# Patient Record
Sex: Female | Born: 1955 | Race: White | Hispanic: No | Smoking: Never smoker
Health system: Southern US, Community
[De-identification: ages and names within clinical notes are randomized; demographics above are authoritative.]

## PROBLEM LIST (undated history)

## (undated) DIAGNOSIS — E079 Disorder of thyroid, unspecified: Secondary | ICD-10-CM

## (undated) DIAGNOSIS — R001 Bradycardia, unspecified: Secondary | ICD-10-CM

## (undated) DIAGNOSIS — T884XXA Failed or difficult intubation, initial encounter: Secondary | ICD-10-CM

## (undated) DIAGNOSIS — I10 Essential (primary) hypertension: Secondary | ICD-10-CM

## (undated) DIAGNOSIS — M51369 Other intervertebral disc degeneration, lumbar region without mention of lumbar back pain or lower extremity pain: Secondary | ICD-10-CM

## (undated) DIAGNOSIS — K219 Gastro-esophageal reflux disease without esophagitis: Secondary | ICD-10-CM

## (undated) DIAGNOSIS — J4540 Moderate persistent asthma, uncomplicated: Secondary | ICD-10-CM

## (undated) DIAGNOSIS — M5136 Other intervertebral disc degeneration, lumbar region: Secondary | ICD-10-CM

## (undated) DIAGNOSIS — M8580 Other specified disorders of bone density and structure, unspecified site: Secondary | ICD-10-CM

## (undated) DIAGNOSIS — E039 Hypothyroidism, unspecified: Secondary | ICD-10-CM

## (undated) DIAGNOSIS — A528 Late syphilis, latent: Secondary | ICD-10-CM

## (undated) DIAGNOSIS — E7800 Pure hypercholesterolemia, unspecified: Secondary | ICD-10-CM

## (undated) HISTORY — PX: COLONOSCOPY: SHX174

## (undated) HISTORY — PX: TONSILLECTOMY AND ADENOIDECTOMY: SUR1326

## (undated) HISTORY — PX: TUBAL LIGATION: SHX77

## (undated) HISTORY — PX: TONSILLECTOMY: SUR1361

---

## 1992-07-10 HISTORY — PX: BACK SURGERY: SHX140

## 2014-09-12 DIAGNOSIS — E039 Hypothyroidism, unspecified: Secondary | ICD-10-CM | POA: Insufficient documentation

## 2014-09-12 DIAGNOSIS — E7800 Pure hypercholesterolemia, unspecified: Secondary | ICD-10-CM | POA: Insufficient documentation

## 2014-09-12 DIAGNOSIS — I10 Essential (primary) hypertension: Secondary | ICD-10-CM | POA: Insufficient documentation

## 2014-12-24 ENCOUNTER — Other Ambulatory Visit: Payer: Self-pay | Admitting: Internal Medicine

## 2014-12-24 DIAGNOSIS — Z1231 Encounter for screening mammogram for malignant neoplasm of breast: Secondary | ICD-10-CM

## 2014-12-24 DIAGNOSIS — M5136 Other intervertebral disc degeneration, lumbar region: Secondary | ICD-10-CM | POA: Insufficient documentation

## 2014-12-24 DIAGNOSIS — M51369 Other intervertebral disc degeneration, lumbar region without mention of lumbar back pain or lower extremity pain: Secondary | ICD-10-CM | POA: Insufficient documentation

## 2014-12-24 DIAGNOSIS — K219 Gastro-esophageal reflux disease without esophagitis: Secondary | ICD-10-CM | POA: Insufficient documentation

## 2014-12-31 ENCOUNTER — Ambulatory Visit: Payer: Self-pay

## 2015-01-04 ENCOUNTER — Ambulatory Visit
Admission: RE | Admit: 2015-01-04 | Discharge: 2015-01-04 | Disposition: A | Discharge status: Home / Self Care | Payer: 59 | Source: Ambulatory Visit | Attending: Internal Medicine | Admitting: Internal Medicine

## 2015-01-04 DIAGNOSIS — Z1231 Encounter for screening mammogram for malignant neoplasm of breast: Secondary | ICD-10-CM | POA: Diagnosis not present

## 2015-12-02 ENCOUNTER — Other Ambulatory Visit: Payer: Self-pay | Admitting: Internal Medicine

## 2015-12-02 DIAGNOSIS — Z1239 Encounter for other screening for malignant neoplasm of breast: Secondary | ICD-10-CM

## 2016-01-07 ENCOUNTER — Other Ambulatory Visit: Payer: Self-pay | Admitting: Internal Medicine

## 2016-01-07 ENCOUNTER — Ambulatory Visit
Admission: RE | Admit: 2016-01-07 | Discharge: 2016-01-07 | Disposition: A | Discharge status: Home / Self Care | Payer: 59 | Source: Ambulatory Visit | Attending: Internal Medicine | Admitting: Internal Medicine

## 2016-01-07 DIAGNOSIS — Z1231 Encounter for screening mammogram for malignant neoplasm of breast: Secondary | ICD-10-CM | POA: Insufficient documentation

## 2016-01-07 DIAGNOSIS — Z1239 Encounter for other screening for malignant neoplasm of breast: Secondary | ICD-10-CM

## 2016-01-16 ENCOUNTER — Emergency Department
Admission: EM | Admit: 2016-01-16 | Discharge: 2016-01-16 | Disposition: A | Discharge status: Home / Self Care | Payer: 59 | Attending: Student | Admitting: Student

## 2016-01-16 ENCOUNTER — Emergency Department: Payer: 59

## 2016-01-16 DIAGNOSIS — R197 Diarrhea, unspecified: Secondary | ICD-10-CM

## 2016-01-16 DIAGNOSIS — I10 Essential (primary) hypertension: Secondary | ICD-10-CM | POA: Insufficient documentation

## 2016-01-16 HISTORY — DX: Disorder of thyroid, unspecified: E07.9

## 2016-01-16 HISTORY — DX: Essential (primary) hypertension: I10

## 2016-01-16 LAB — URINALYSIS COMPLETE WITH MICROSCOPIC (ARMC ONLY)
Bilirubin Urine: NEGATIVE
GLUCOSE, UA: NEGATIVE mg/dL
Hgb urine dipstick: NEGATIVE
Leukocytes, UA: NEGATIVE
NITRITE: NEGATIVE
PROTEIN: 30 mg/dL — AB
SPECIFIC GRAVITY, URINE: 1.028 (ref 1.005–1.030)
pH: 5 (ref 5.0–8.0)

## 2016-01-16 LAB — COMPREHENSIVE METABOLIC PANEL
ALK PHOS: 67 U/L (ref 38–126)
ALT: 18 U/L (ref 14–54)
ANION GAP: 8 (ref 5–15)
AST: 20 U/L (ref 15–41)
Albumin: 4.3 g/dL (ref 3.5–5.0)
BILIRUBIN TOTAL: 0.3 mg/dL (ref 0.3–1.2)
BUN: 16 mg/dL (ref 6–20)
CALCIUM: 9.1 mg/dL (ref 8.9–10.3)
CO2: 24 mmol/L (ref 22–32)
CREATININE: 0.79 mg/dL (ref 0.44–1.00)
Chloride: 107 mmol/L (ref 101–111)
GFR calc Af Amer: >60 mL/min (ref >60)
GFR calc non Af Amer: >60 mL/min (ref >60)
GLUCOSE: 92 mg/dL (ref 65–99)
Potassium: 3.4 mmol/L — ABNORMAL LOW (ref 3.5–5.1)
SODIUM: 139 mmol/L (ref 135–145)
TOTAL PROTEIN: 7.9 g/dL (ref 6.5–8.1)

## 2016-01-16 LAB — LIPASE, BLOOD: LIPASE: 17 U/L (ref 11–51)

## 2016-01-16 LAB — CBC
HCT: 39.7 % (ref 35.0–47.0)
HEMOGLOBIN: 13.9 g/dL (ref 12.0–16.0)
MCH: 31.7 pg (ref 26.0–34.0)
MCHC: 34.9 g/dL (ref 32.0–36.0)
MCV: 90.7 fL (ref 80.0–100.0)
PLATELETS: 215 10*3/uL (ref 150–440)
RBC: 4.38 MIL/uL (ref 3.80–5.20)
RDW: 13.3 % (ref 11.5–14.5)
WBC: 7.1 10*3/uL (ref 3.6–11.0)

## 2016-01-16 LAB — TROPONIN I: Troponin I: <0.03 ng/mL (ref <0.03)

## 2016-01-16 NOTE — ED Notes (Signed)
Pt verbalized understanding of discharge instructions. NAD at this time. 

## 2016-01-16 NOTE — ED Provider Notes (Signed)
[facility] Emergency Department Provider Note   ____________________________________________  Time seen: Approximately 3:03 PM  I have reviewed the triage vital signs and the nursing notes.   HISTORY  Chief Complaint Headache and Diarrhea    HPI Kathryn Whitehead is a 60 y.o. female with history of hypertension and thyroid disease who presents for evaluation of one day of nonbloody diarrhea, gradual onset, constant since onset, initially severe, now nearly resolved, improved with Pepto-Bismol. She has also had abdominal cramping and nausea without vomiting. She has had mild headache but no vision change, numbness or weakness. She is concerned that she may have "heat exhaustion" as she was working in a wood shed in the heat over the past 2 days. Last night she had a sharp chest pain in the right chest "in a small circle", it lasted a few seconds and resolves, it is nonradiating not associated with exertion, sudden sweating or nausea or vomiting. Was not exertional or pleuritic. She has not had any chest pain since that time. Currently she reports she feels a little bit fatigued but is in no pain.   Past Medical History  Diagnosis Date  . Thyroid disease   . Hypertension     There are no active problems to display for this patient.   History reviewed. No pertinent past surgical history.  No current outpatient prescriptions on file.  Allergies Ampicillin  No family history on file.  Social History Social History  Substance Use Topics  . Smoking status: Never Smoker   . Smokeless tobacco: None  . Alcohol Use: Yes    Review of Systems Constitutional: No fever/chills Eyes: No visual changes. ENT: No sore throat. Cardiovascular: + chest pain. Respiratory: Denies shortness of breath. Gastrointestinal: + abdominal cramping.  + nausea, no vomiting.  + diarrhea.  No constipation. Genitourinary: Negative for dysuria. Musculoskeletal: Negative for back  pain. Skin: Negative for rash. Neurological: Negative for headaches, focal weakness or numbness.  10-point ROS otherwise negative.  ____________________________________________   PHYSICAL EXAM:  VITAL SIGNS: ED Triage Vitals  Enc Vitals Group     BP 01/16/16 1353 114/80 mmHg     Pulse Rate 01/16/16 1353 76     Resp 01/16/16 1353 18     Temp 01/16/16 1353 98.5 F (36.9 C)     Temp Source 01/16/16 1353 Oral     SpO2 01/16/16 1353 97 %     Weight 01/16/16 1353 147 lb (66.679 kg)     Height 01/16/16 1353 5\' 2"  (1.575 m)     Head Cir --      Peak Flow --      Pain Score 01/16/16 1355 3     Pain Loc --      Pain Edu? --      Excl. in [place]? --     Constitutional: Alert and oriented. Well appearing and in no acute distress.Lying comfortably in bed, smiling, pleasant Eyes: Conjunctivae are normal. PERRL. EOMI. Head: Atraumatic. Nose: No congestion/rhinnorhea. Mouth/Throat: Mucous membranes are moist.  Oropharynx non-erythematous. Neck: No stridor.  Supple without meningitis. Cardiovascular: Normal rate, regular rhythm. Grossly normal heart sounds.  Good peripheral circulation. Respiratory: Normal respiratory effort.  No retractions. Lungs CTAB. Gastrointestinal: Soft and nontender. No distention.  No CVA tenderness. Genitourinary: Deferred Musculoskeletal: No lower extremity tenderness nor edema.  No joint effusions. Neurologic:  Normal speech and language. No gross focal neurologic deficits are appreciated. No gait instability. 5 out of 5 strength bilateral upper and lower extremities,  sensation intact to light touch throughout. Skin:  Skin is warm, dry and intact. No rash noted. Psychiatric: Mood and affect are normal. Speech and behavior are normal.  ____________________________________________   LABS (all labs ordered are listed, but only abnormal results are displayed)  Labs Reviewed  COMPREHENSIVE METABOLIC PANEL - Abnormal; Notable for the following:    Potassium 3.4  (*)    All other components within normal limits  URINALYSIS COMPLETEWITH MICROSCOPIC ([facility] ONLY) - Abnormal; Notable for the following:    Color, Urine AMBER (*)    APPearance HAZY (*)    Ketones, ur 1+ (*)    Protein, ur 30 (*)    Bacteria, UA RARE (*)    Squamous Epithelial / LPF 0-5 (*)    All other components within normal limits  LIPASE, BLOOD  CBC  TROPONIN I   ____________________________________________  EKG  ED ECG REPORT I, Joanne Gavel, the attending physician, personally viewed and interpreted this ECG.   Date: 01/16/2016  EKG Time: 13:59  Rate: 75  Rhythm: normal sinus rhythm  Axis: normal  Intervals:none  ST&T Change: No acute ST elevation or acute ST depression.  ____________________________________________  RADIOLOGY  CXR  IMPRESSION: No edema or consolidation.  ____________________________________________   PROCEDURES  Procedure(s) performed: None  Procedures  Critical Care performed: No  ____________________________________________   INITIAL IMPRESSION / ASSESSMENT AND PLAN / ED COURSE  Pertinent labs & imaging results that were available during my care of the patient were reviewed by me and considered in my medical decision making (see chart for details).  Cobi Malcomb is a 61 y.o. female with history of hypertension and thyroid disease who presents for evaluation of one day of nonbloody diarrhea, he is had no diarrhea today. On exam, she is very well-appearing and in no acute distress. Her vital signs are stable, she is afebrile. She has a benign abdominal exam, intact neurological examination, appears very well hydrated. Suspect viral syndrome is the most likely cause of her diarrhea, back, round and with her being out in the heat for long. The time certainly could cause mild dehydration which could account for her symptoms. Nonspecific chest pain occurred once last night, and has not recurred, no history of coronary artery disease,  not consistent with PE or acute aortic dissection. EKG reassuring, troponin negative. Labs reviewed and are generally unremarkable, chest x-ray clear. Awaiting urinalysis after which anticipate discharge home with a structures to push oral fluids and follow up with her primary care doctor within the next 2-3 days. I doubt any acute life threatening pathology and is very well-appearing patient with a benign physical exam, no current pain, normal labs and normal vital signs.  ----------------------------------------- 4:16 PM on 01/16/2016 ----------------------------------------- Urinalysis is not consistent with infection. Patient is postmenopausal. She continues to appear well. We discussed return precautions, need for close PCP follow-up, oral hydration and she is comfortable with the discharge plan. DC home.  ____________________________________________   FINAL CLINICAL IMPRESSION(S) / ED DIAGNOSES  Final diagnoses:  Diarrhea, unspecified type      NEW MEDICATIONS STARTED DURING THIS VISIT:  New Prescriptions   No medications on file     Note:  This document was prepared using Dragon voice recognition software and may include unintentional dictation errors.    Joanne Gavel, MD 01/16/16 [phone]

## 2016-01-16 NOTE — ED Notes (Signed)
Pt latter comments she had episode of sharp CP to right side of chest yesterday.

## 2016-01-16 NOTE — ED Notes (Addendum)
PT arrives to ER via POV c/o headache, diarrhea and generalized "not feeling well". Pt concerned with head exhaustion. Pt alert and oriented X4, active, cooperative, pt in NAD. RR even and unlabored, color WNL.

## 2016-03-30 DIAGNOSIS — Z780 Asymptomatic menopausal state: Secondary | ICD-10-CM | POA: Insufficient documentation

## 2016-07-12 ENCOUNTER — Other Ambulatory Visit: Payer: Self-pay | Admitting: Internal Medicine

## 2016-07-12 DIAGNOSIS — I10 Essential (primary) hypertension: Secondary | ICD-10-CM | POA: Diagnosis not present

## 2016-07-12 DIAGNOSIS — E7800 Pure hypercholesterolemia, unspecified: Secondary | ICD-10-CM | POA: Diagnosis not present

## 2016-07-12 DIAGNOSIS — M545 Low back pain: Secondary | ICD-10-CM | POA: Diagnosis not present

## 2016-07-12 DIAGNOSIS — E039 Hypothyroidism, unspecified: Secondary | ICD-10-CM | POA: Diagnosis not present

## 2016-07-12 DIAGNOSIS — Z1239 Encounter for other screening for malignant neoplasm of breast: Secondary | ICD-10-CM

## 2016-07-12 DIAGNOSIS — G8929 Other chronic pain: Secondary | ICD-10-CM | POA: Diagnosis not present

## 2016-07-12 DIAGNOSIS — M419 Scoliosis, unspecified: Secondary | ICD-10-CM | POA: Diagnosis not present

## 2016-09-05 DIAGNOSIS — N951 Menopausal and female climacteric states: Secondary | ICD-10-CM | POA: Insufficient documentation

## 2016-09-05 DIAGNOSIS — G8929 Other chronic pain: Secondary | ICD-10-CM | POA: Insufficient documentation

## 2016-09-05 DIAGNOSIS — I10 Essential (primary) hypertension: Secondary | ICD-10-CM | POA: Diagnosis not present

## 2016-09-05 DIAGNOSIS — M549 Dorsalgia, unspecified: Secondary | ICD-10-CM | POA: Diagnosis not present

## 2016-09-05 DIAGNOSIS — M545 Low back pain: Secondary | ICD-10-CM | POA: Diagnosis not present

## 2016-10-09 DIAGNOSIS — L814 Other melanin hyperpigmentation: Secondary | ICD-10-CM | POA: Diagnosis not present

## 2016-10-12 DIAGNOSIS — E039 Hypothyroidism, unspecified: Secondary | ICD-10-CM | POA: Diagnosis not present

## 2016-10-12 DIAGNOSIS — E7800 Pure hypercholesterolemia, unspecified: Secondary | ICD-10-CM | POA: Diagnosis not present

## 2016-10-12 DIAGNOSIS — I10 Essential (primary) hypertension: Secondary | ICD-10-CM | POA: Diagnosis not present

## 2017-01-09 ENCOUNTER — Ambulatory Visit
Admission: RE | Admit: 2017-01-09 | Discharge: 2017-01-09 | Disposition: A | Discharge status: Home / Self Care | Payer: 59 | Source: Ambulatory Visit | Attending: Internal Medicine | Admitting: Internal Medicine

## 2017-01-09 ENCOUNTER — Ambulatory Visit: Payer: 59

## 2017-01-09 DIAGNOSIS — Z1231 Encounter for screening mammogram for malignant neoplasm of breast: Secondary | ICD-10-CM | POA: Diagnosis not present

## 2017-01-09 DIAGNOSIS — Z1239 Encounter for other screening for malignant neoplasm of breast: Secondary | ICD-10-CM

## 2017-03-28 DIAGNOSIS — E7800 Pure hypercholesterolemia, unspecified: Secondary | ICD-10-CM | POA: Diagnosis not present

## 2017-03-28 DIAGNOSIS — Z131 Encounter for screening for diabetes mellitus: Secondary | ICD-10-CM | POA: Diagnosis not present

## 2017-03-28 DIAGNOSIS — I10 Essential (primary) hypertension: Secondary | ICD-10-CM | POA: Diagnosis not present

## 2017-04-04 DIAGNOSIS — Z0000 Encounter for general adult medical examination without abnormal findings: Secondary | ICD-10-CM | POA: Diagnosis not present

## 2017-04-04 DIAGNOSIS — J4540 Moderate persistent asthma, uncomplicated: Secondary | ICD-10-CM | POA: Insufficient documentation

## 2017-04-04 DIAGNOSIS — Z23 Encounter for immunization: Secondary | ICD-10-CM | POA: Diagnosis not present

## 2017-04-04 DIAGNOSIS — R21 Rash and other nonspecific skin eruption: Secondary | ICD-10-CM | POA: Diagnosis not present

## 2017-09-26 DIAGNOSIS — E782 Mixed hyperlipidemia: Secondary | ICD-10-CM | POA: Diagnosis not present

## 2017-10-02 DIAGNOSIS — I10 Essential (primary) hypertension: Secondary | ICD-10-CM | POA: Diagnosis not present

## 2017-10-02 DIAGNOSIS — E039 Hypothyroidism, unspecified: Secondary | ICD-10-CM | POA: Diagnosis not present

## 2017-10-02 DIAGNOSIS — R05 Cough: Secondary | ICD-10-CM | POA: Diagnosis not present

## 2017-10-19 DIAGNOSIS — T50905A Adverse effect of unspecified drugs, medicaments and biological substances, initial encounter: Secondary | ICD-10-CM | POA: Diagnosis not present

## 2017-10-19 DIAGNOSIS — L299 Pruritus, unspecified: Secondary | ICD-10-CM | POA: Diagnosis not present

## 2017-10-19 DIAGNOSIS — I10 Essential (primary) hypertension: Secondary | ICD-10-CM | POA: Diagnosis not present

## 2017-11-07 DIAGNOSIS — I10 Essential (primary) hypertension: Secondary | ICD-10-CM | POA: Diagnosis not present

## 2017-11-07 DIAGNOSIS — R001 Bradycardia, unspecified: Secondary | ICD-10-CM | POA: Insufficient documentation

## 2017-12-24 DIAGNOSIS — R768 Other specified abnormal immunological findings in serum: Secondary | ICD-10-CM | POA: Diagnosis not present

## 2018-01-08 DIAGNOSIS — I10 Essential (primary) hypertension: Secondary | ICD-10-CM | POA: Diagnosis not present

## 2018-01-08 DIAGNOSIS — A528 Late syphilis, latent: Secondary | ICD-10-CM | POA: Insufficient documentation

## 2018-02-04 ENCOUNTER — Other Ambulatory Visit: Payer: Self-pay | Admitting: Internal Medicine

## 2018-02-04 DIAGNOSIS — Z1231 Encounter for screening mammogram for malignant neoplasm of breast: Secondary | ICD-10-CM

## 2018-02-19 DIAGNOSIS — I10 Essential (primary) hypertension: Secondary | ICD-10-CM | POA: Diagnosis not present

## 2018-02-22 ENCOUNTER — Ambulatory Visit
Admission: RE | Admit: 2018-02-22 | Discharge: 2018-02-22 | Disposition: A | Discharge status: Home / Self Care | Payer: 59 | Source: Ambulatory Visit | Attending: Internal Medicine | Admitting: Internal Medicine

## 2018-02-22 DIAGNOSIS — Z1231 Encounter for screening mammogram for malignant neoplasm of breast: Secondary | ICD-10-CM | POA: Insufficient documentation

## 2018-04-12 DIAGNOSIS — I10 Essential (primary) hypertension: Secondary | ICD-10-CM | POA: Diagnosis not present

## 2018-04-19 DIAGNOSIS — E7800 Pure hypercholesterolemia, unspecified: Secondary | ICD-10-CM | POA: Diagnosis not present

## 2018-04-19 DIAGNOSIS — I10 Essential (primary) hypertension: Secondary | ICD-10-CM | POA: Diagnosis not present

## 2018-04-19 DIAGNOSIS — E039 Hypothyroidism, unspecified: Secondary | ICD-10-CM | POA: Diagnosis not present

## 2018-04-24 DIAGNOSIS — Z23 Encounter for immunization: Secondary | ICD-10-CM | POA: Diagnosis not present

## 2018-05-24 DIAGNOSIS — I10 Essential (primary) hypertension: Secondary | ICD-10-CM | POA: Diagnosis not present

## 2018-07-11 DIAGNOSIS — E039 Hypothyroidism, unspecified: Secondary | ICD-10-CM | POA: Diagnosis not present

## 2018-07-17 DIAGNOSIS — E039 Hypothyroidism, unspecified: Secondary | ICD-10-CM | POA: Diagnosis not present

## 2018-07-23 DIAGNOSIS — J101 Influenza due to other identified influenza virus with other respiratory manifestations: Secondary | ICD-10-CM | POA: Diagnosis not present

## 2018-07-23 DIAGNOSIS — R509 Fever, unspecified: Secondary | ICD-10-CM | POA: Diagnosis not present

## 2018-10-11 DIAGNOSIS — E039 Hypothyroidism, unspecified: Secondary | ICD-10-CM | POA: Diagnosis not present

## 2018-10-23 ENCOUNTER — Other Ambulatory Visit: Payer: Self-pay | Admitting: Internal Medicine

## 2018-10-23 DIAGNOSIS — Z1231 Encounter for screening mammogram for malignant neoplasm of breast: Secondary | ICD-10-CM

## 2018-10-23 DIAGNOSIS — M8589 Other specified disorders of bone density and structure, multiple sites: Secondary | ICD-10-CM | POA: Insufficient documentation

## 2018-10-23 DIAGNOSIS — Z0000 Encounter for general adult medical examination without abnormal findings: Secondary | ICD-10-CM | POA: Diagnosis not present

## 2018-10-23 DIAGNOSIS — Z1239 Encounter for other screening for malignant neoplasm of breast: Secondary | ICD-10-CM | POA: Diagnosis not present

## 2019-02-05 ENCOUNTER — Other Ambulatory Visit: Payer: Self-pay | Admitting: Physician Assistant

## 2019-02-05 DIAGNOSIS — M542 Cervicalgia: Secondary | ICD-10-CM

## 2019-02-05 DIAGNOSIS — M5412 Radiculopathy, cervical region: Secondary | ICD-10-CM

## 2019-02-17 ENCOUNTER — Ambulatory Visit: Payer: 59

## 2019-02-17 ENCOUNTER — Other Ambulatory Visit: Payer: Self-pay | Admitting: Physician Assistant

## 2019-02-17 ENCOUNTER — Telehealth: Payer: Self-pay | Admitting: Physician Assistant

## 2019-02-17 DIAGNOSIS — Z1231 Encounter for screening mammogram for malignant neoplasm of breast: Secondary | ICD-10-CM

## 2019-02-25 ENCOUNTER — Ambulatory Visit: Payer: 59

## 2019-03-19 ENCOUNTER — Ambulatory Visit
Admission: RE | Admit: 2019-03-19 | Discharge: 2019-03-19 | Disposition: A | Discharge status: Home / Self Care | Payer: 59 | Source: Ambulatory Visit | Attending: Physician Assistant | Admitting: Physician Assistant

## 2019-03-19 DIAGNOSIS — Z1231 Encounter for screening mammogram for malignant neoplasm of breast: Secondary | ICD-10-CM

## 2019-03-25 ENCOUNTER — Other Ambulatory Visit: Payer: Self-pay | Admitting: Physician Assistant

## 2019-03-25 DIAGNOSIS — M542 Cervicalgia: Secondary | ICD-10-CM

## 2019-03-25 DIAGNOSIS — M5412 Radiculopathy, cervical region: Secondary | ICD-10-CM

## 2019-04-03 ENCOUNTER — Ambulatory Visit
Admission: RE | Admit: 2019-04-03 | Discharge: 2019-04-03 | Disposition: A | Discharge status: Home / Self Care | Payer: 59 | Source: Ambulatory Visit | Attending: Physician Assistant | Admitting: Physician Assistant

## 2019-04-03 ENCOUNTER — Other Ambulatory Visit: Payer: Self-pay

## 2019-04-03 DIAGNOSIS — M542 Cervicalgia: Secondary | ICD-10-CM | POA: Diagnosis present

## 2019-04-03 DIAGNOSIS — M5412 Radiculopathy, cervical region: Secondary | ICD-10-CM | POA: Diagnosis present

## 2019-04-10 ENCOUNTER — Other Ambulatory Visit: Payer: Self-pay | Admitting: Neurosurgery

## 2019-04-16 ENCOUNTER — Encounter
Admission: RE | Admit: 2019-04-16 | Discharge: 2019-04-16 | Disposition: A | Discharge status: Home / Self Care | Payer: 59 | Source: Ambulatory Visit | Attending: Neurosurgery | Admitting: Neurosurgery

## 2019-04-16 ENCOUNTER — Other Ambulatory Visit: Payer: Self-pay

## 2019-04-16 DIAGNOSIS — Z01812 Encounter for preprocedural laboratory examination: Secondary | ICD-10-CM | POA: Diagnosis not present

## 2019-04-16 DIAGNOSIS — R001 Bradycardia, unspecified: Secondary | ICD-10-CM | POA: Diagnosis not present

## 2019-04-16 DIAGNOSIS — Z01810 Encounter for preprocedural cardiovascular examination: Secondary | ICD-10-CM | POA: Diagnosis present

## 2019-04-16 HISTORY — DX: Gastro-esophageal reflux disease without esophagitis: K21.9

## 2019-04-16 HISTORY — DX: Pure hypercholesterolemia, unspecified: E78.00

## 2019-04-16 HISTORY — DX: Hypothyroidism, unspecified: E03.9

## 2019-04-16 LAB — PROTIME-INR
INR: 0.9 (ref 0.8–1.2)
Prothrombin Time: 12 seconds (ref 11.4–15.2)

## 2019-04-16 LAB — URINALYSIS, ROUTINE W REFLEX MICROSCOPIC
Bacteria, UA: NONE SEEN
Bilirubin Urine: NEGATIVE
Glucose, UA: NEGATIVE mg/dL
Hgb urine dipstick: NEGATIVE
Ketones, ur: NEGATIVE mg/dL
Nitrite: NEGATIVE
Protein, ur: NEGATIVE mg/dL
Specific Gravity, Urine: 1.019 (ref 1.005–1.030)
pH: 6 (ref 5.0–8.0)

## 2019-04-16 LAB — CBC
HCT: 39.3 % (ref 36.0–46.0)
Hemoglobin: 13 g/dL (ref 12.0–15.0)
MCH: 30.1 pg (ref 26.0–34.0)
MCHC: 33.1 g/dL (ref 30.0–36.0)
MCV: 91 fL (ref 80.0–100.0)
Platelets: 230 10*3/uL (ref 150–400)
RBC: 4.32 MIL/uL (ref 3.87–5.11)
RDW: 13.2 % (ref 11.5–15.5)
WBC: 6.1 10*3/uL (ref 4.0–10.5)
nRBC: 0 % (ref 0.0–0.2)

## 2019-04-16 LAB — BASIC METABOLIC PANEL
Anion gap: 9 (ref 5–15)
BUN: 13 mg/dL (ref 8–23)
CO2: 31 mmol/L (ref 22–32)
Calcium: 9 mg/dL (ref 8.9–10.3)
Chloride: 100 mmol/L (ref 98–111)
Creatinine, Ser: 0.69 mg/dL (ref 0.44–1.00)
GFR calc Af Amer: >60 mL/min (ref >60)
GFR calc non Af Amer: >60 mL/min (ref >60)
Glucose, Bld: 92 mg/dL (ref 70–99)
Potassium: 3.2 mmol/L — ABNORMAL LOW (ref 3.5–5.1)
Sodium: 140 mmol/L (ref 135–145)

## 2019-04-16 LAB — SURGICAL PCR SCREEN
MRSA, PCR: NEGATIVE
Staphylococcus aureus: NEGATIVE

## 2019-04-16 LAB — APTT: aPTT: 37 seconds — ABNORMAL HIGH (ref 24–36)

## 2019-04-16 NOTE — Patient Instructions (Signed)
Your procedure is scheduled on: Wednesday, October 14, 20 Report to Day Surgery on the 2nd floor of the [facility]. To find out your arrival time, please call [phone] between 1PM - 3PM on: Tuesday, October 13  REMEMBER: Instructions that are not followed completely may result in serious medical risk, up to and including death; or upon the discretion of your surgeon and anesthesiologist your surgery may need to be rescheduled.  Do not eat food after midnight the night before surgery.  No gum chewing, lozengers or hard candies.  You may however, drink CLEAR liquids up to 2 hours before you are scheduled to arrive for your surgery. Do not drink anything within 2 hours of the start of your surgery.  Clear liquids include: - water  - apple juice without pulp - gatorade - black coffee or tea (Do NOT add milk or creamers to the coffee or tea) Do NOT drink anything that is not on this list.  No Alcohol for 24 hours before or after surgery.  No Smoking including e-cigarettes for 24 hours prior to surgery.  No chewable tobacco products for at least 6 hours prior to surgery.  No nicotine patches on the day of surgery.  On the morning of surgery brush your teeth with toothpaste and water, you may rinse your mouth with mouthwash if you wish. Do not swallow any toothpaste or mouthwash.  Notify your doctor if there is any change in your medical condition (cold, fever, infection).  Do not wear jewelry, make-up, hairpins, clips or nail polish.  Do not wear lotions, powders, or perfumes.   Do not shave 48 hours prior to surgery.   Contacts and dentures may not be worn into surgery.  Do not bring valuables to the hospital, including drivers license, insurance or credit cards.  [facility] is not responsible for any belongings or valuables.   TAKE THESE MEDICATIONS THE MORNING OF SURGERY:  1.  Hydralazine 2.  Levothyroxine 3.  Protonix  - (take one the night before and one on the  morning of surgery - helps to prevent nausea after surgery.)  Use CHG Soap as directed on instruction sheet.  NOW!  Stop Anti-inflammatories (NSAIDS) such as Advil, Aleve, Ibuprofen, Motrin, Naproxen, Naprosyn and Aspirin based products such as Excedrin, Goodys Powder, BC Powder. (May take Tylenol or Acetaminophen if needed.)  NOW!  Stop ANY OVER THE COUNTER supplements until after surgery. (CALCIUM, ESTROVEN) (May continue Vitamin D.)  Wear comfortable clothing (specific to your surgery type) to the hospital.  Please call [phone] if you have any questions about these instructions.

## 2019-04-17 LAB — TYPE AND SCREEN
ABO/RH(D): O POS
Antibody Screen: NEGATIVE

## 2019-04-18 ENCOUNTER — Other Ambulatory Visit: Payer: Self-pay

## 2019-04-18 ENCOUNTER — Other Ambulatory Visit
Admission: RE | Admit: 2019-04-18 | Discharge: 2019-04-18 | Disposition: A | Discharge status: Home / Self Care | Payer: 59 | Source: Ambulatory Visit | Attending: Neurosurgery | Admitting: Neurosurgery

## 2019-04-18 DIAGNOSIS — Z20828 Contact with and (suspected) exposure to other viral communicable diseases: Secondary | ICD-10-CM | POA: Diagnosis not present

## 2019-04-18 DIAGNOSIS — Z01812 Encounter for preprocedural laboratory examination: Secondary | ICD-10-CM | POA: Insufficient documentation

## 2019-04-18 LAB — SARS CORONAVIRUS 2 (TAT 6-24 HRS): SARS Coronavirus 2: NEGATIVE

## 2019-04-19 ENCOUNTER — Telehealth: Payer: Self-pay

## 2019-04-19 NOTE — Telephone Encounter (Signed)
Patient called to receive covid test results, results given as noted negative which means she was not infected with the novel coronavirus.  Pt verbalized understanding.

## 2019-04-23 ENCOUNTER — Ambulatory Visit: Payer: 59 | Admitting: Certified Registered"

## 2019-04-23 ENCOUNTER — Observation Stay: Payer: 59

## 2019-04-23 ENCOUNTER — Observation Stay
Admission: RE | Admit: 2019-04-23 | Discharge: 2019-04-24 | Disposition: A | Discharge status: Home / Self Care | Payer: 59 | Attending: Neurosurgery | Admitting: Neurosurgery

## 2019-04-23 ENCOUNTER — Encounter: Payer: Self-pay | Admitting: *Deleted

## 2019-04-23 ENCOUNTER — Encounter
Admission: RE | Disposition: A | Discharge status: Home / Self Care | Payer: Self-pay | Source: Home / Self Care | Attending: Neurosurgery

## 2019-04-23 ENCOUNTER — Ambulatory Visit: Payer: 59

## 2019-04-23 ENCOUNTER — Other Ambulatory Visit: Payer: Self-pay

## 2019-04-23 DIAGNOSIS — M4322 Fusion of spine, cervical region: Secondary | ICD-10-CM

## 2019-04-23 DIAGNOSIS — Z79890 Hormone replacement therapy (postmenopausal): Secondary | ICD-10-CM | POA: Insufficient documentation

## 2019-04-23 DIAGNOSIS — K219 Gastro-esophageal reflux disease without esophagitis: Secondary | ICD-10-CM | POA: Insufficient documentation

## 2019-04-23 DIAGNOSIS — I10 Essential (primary) hypertension: Secondary | ICD-10-CM | POA: Diagnosis not present

## 2019-04-23 DIAGNOSIS — Z79899 Other long term (current) drug therapy: Secondary | ICD-10-CM | POA: Insufficient documentation

## 2019-04-23 DIAGNOSIS — E039 Hypothyroidism, unspecified: Secondary | ICD-10-CM | POA: Insufficient documentation

## 2019-04-23 DIAGNOSIS — G959 Disease of spinal cord, unspecified: Secondary | ICD-10-CM | POA: Diagnosis present

## 2019-04-23 DIAGNOSIS — M4712 Other spondylosis with myelopathy, cervical region: Principal | ICD-10-CM | POA: Insufficient documentation

## 2019-04-23 DIAGNOSIS — Z23 Encounter for immunization: Secondary | ICD-10-CM | POA: Insufficient documentation

## 2019-04-23 DIAGNOSIS — Z419 Encounter for procedure for purposes other than remedying health state, unspecified: Secondary | ICD-10-CM

## 2019-04-23 DIAGNOSIS — E7800 Pure hypercholesterolemia, unspecified: Secondary | ICD-10-CM | POA: Insufficient documentation

## 2019-04-23 HISTORY — PX: ANTERIOR CERVICAL DECOMP/DISCECTOMY FUSION: SHX1161

## 2019-04-23 HISTORY — DX: Failed or difficult intubation, initial encounter: T88.4XXA

## 2019-04-23 LAB — POCT I-STAT, CHEM 8
BUN: 14 mg/dL (ref 8–23)
Calcium, Ion: 1.19 mmol/L (ref 1.15–1.40)
Chloride: 104 mmol/L (ref 98–111)
Creatinine, Ser: 0.6 mg/dL (ref 0.44–1.00)
Glucose, Bld: 105 mg/dL — ABNORMAL HIGH (ref 70–99)
HCT: 42 % (ref 36.0–46.0)
Hemoglobin: 14.3 g/dL (ref 12.0–15.0)
Potassium: 3.5 mmol/L (ref 3.5–5.1)
Sodium: 140 mmol/L (ref 135–145)
TCO2: 24 mmol/L (ref 22–32)

## 2019-04-23 LAB — ABO/RH: ABO/RH(D): O POS

## 2019-04-23 SURGERY — ANTERIOR CERVICAL DECOMPRESSION/DISCECTOMY FUSION 1 LEVEL
Anesthesia: General

## 2019-04-23 MED ORDER — PROPOFOL 10 MG/ML IV BOLUS
INTRAVENOUS | Status: DC | PRN
  Administered 2019-04-23: 125 ug/kg/min via INTRAVENOUS

## 2019-04-23 MED ORDER — METHOCARBAMOL 1000 MG/10ML IJ SOLN
500.0000 mg | Freq: Four times a day (QID) | INTRAVENOUS | Status: DC | PRN
  Administered 2019-04-23: 500 mg via INTRAVENOUS
  Filled 2019-04-23 (×2): qty 5

## 2019-04-23 MED ORDER — THROMBIN 5000 UNITS EX SOLR
CUTANEOUS | Status: AC
  Filled 2019-04-23: qty 5000

## 2019-04-23 MED ORDER — VITAMIN D 25 MCG (1000 UNIT) PO TABS
1000.0000 [IU] | ORAL_TABLET | Freq: Every day | ORAL | Status: DC
  Administered 2019-04-23 – 2019-04-24 (×2): 1000 [IU] via ORAL
  Filled 2019-04-23 (×2): qty 1

## 2019-04-23 MED ORDER — HYDRALAZINE HCL 50 MG PO TABS
50.0000 mg | ORAL_TABLET | Freq: Two times a day (BID) | ORAL | Status: DC
  Administered 2019-04-23: 50 mg via ORAL
  Filled 2019-04-23: qty 1

## 2019-04-23 MED ORDER — SUCCINYLCHOLINE CHLORIDE 20 MG/ML IJ SOLN
INTRAMUSCULAR | Status: DC | PRN
  Administered 2019-04-23: 100 mg via INTRAVENOUS

## 2019-04-23 MED ORDER — CELECOXIB 200 MG PO CAPS
200.0000 mg | ORAL_CAPSULE | Freq: Two times a day (BID) | ORAL | Status: DC
  Administered 2019-04-23 – 2019-04-24 (×3): 200 mg via ORAL
  Filled 2019-04-23 (×3): qty 1

## 2019-04-23 MED ORDER — PANTOPRAZOLE SODIUM 40 MG PO TBEC
40.0000 mg | DELAYED_RELEASE_TABLET | Freq: Every day | ORAL | Status: DC
  Administered 2019-04-24: 40 mg via ORAL
  Filled 2019-04-23: qty 1

## 2019-04-23 MED ORDER — PHENOL 1.4 % MT LIQD
1.0000 | OROMUCOSAL | Status: DC | PRN
  Filled 2019-04-23: qty 177

## 2019-04-23 MED ORDER — BUPIVACAINE-EPINEPHRINE (PF) 0.5% -1:200000 IJ SOLN
INTRAMUSCULAR | Status: AC
  Filled 2019-04-23: qty 30

## 2019-04-23 MED ORDER — DEXAMETHASONE SODIUM PHOSPHATE 10 MG/ML IJ SOLN
INTRAMUSCULAR | Status: DC | PRN
  Administered 2019-04-23: 10 mg via INTRAVENOUS

## 2019-04-23 MED ORDER — PROPOFOL 500 MG/50ML IV EMUL
INTRAVENOUS | Status: AC
  Filled 2019-04-23: qty 50

## 2019-04-23 MED ORDER — SODIUM CHLORIDE 0.9% FLUSH: 3.0000 mL | INTRAVENOUS | Status: DC | PRN

## 2019-04-23 MED ORDER — VANCOMYCIN HCL IN DEXTROSE 1-5 GM/200ML-% IV SOLN
INTRAVENOUS | Status: AC
  Filled 2019-04-23: qty 200

## 2019-04-23 MED ORDER — ONDANSETRON HCL 4 MG PO TABS: 4.0000 mg | ORAL_TABLET | Freq: Four times a day (QID) | ORAL | Status: DC | PRN

## 2019-04-23 MED ORDER — HYDROCHLOROTHIAZIDE 25 MG PO TABS: 25.0000 mg | ORAL_TABLET | Freq: Every day | ORAL | Status: DC

## 2019-04-23 MED ORDER — CALCIUM CARBONATE ANTACID 500 MG PO CHEW
600.0000 mg | CHEWABLE_TABLET | Freq: Every day | ORAL | Status: DC
  Administered 2019-04-24: 600 mg via ORAL
  Filled 2019-04-23: qty 3

## 2019-04-23 MED ORDER — SIMVASTATIN 20 MG PO TABS
10.0000 mg | ORAL_TABLET | Freq: Every day | ORAL | Status: DC
  Administered 2019-04-23: 10 mg via ORAL
  Filled 2019-04-23: qty 1

## 2019-04-23 MED ORDER — SODIUM CHLORIDE 0.9 % IR SOLN
Status: DC | PRN
  Administered 2019-04-23: 250 mL

## 2019-04-23 MED ORDER — OXYCODONE HCL 5 MG PO TABS
5.0000 mg | ORAL_TABLET | Freq: Once | ORAL | Status: AC | PRN
  Administered 2019-04-23: 14:00:00 5 mg via ORAL

## 2019-04-23 MED ORDER — MIDAZOLAM HCL 2 MG/2ML IJ SOLN
INTRAMUSCULAR | Status: AC
  Filled 2019-04-23: qty 2

## 2019-04-23 MED ORDER — ACETAMINOPHEN 325 MG PO TABS: 650.0000 mg | ORAL_TABLET | ORAL | Status: DC | PRN

## 2019-04-23 MED ORDER — LACTATED RINGERS IV SOLN
INTRAVENOUS | Status: DC | PRN
  Administered 2019-04-23: 10:00:00 via INTRAVENOUS

## 2019-04-23 MED ORDER — REMIFENTANIL HCL 1 MG IV SOLR
INTRAVENOUS | Status: DC | PRN
  Administered 2019-04-23: .05 ug/kg/min via INTRAVENOUS

## 2019-04-23 MED ORDER — REMIFENTANIL HCL 1 MG IV SOLR
INTRAVENOUS | Status: AC
  Filled 2019-04-23: qty 1000

## 2019-04-23 MED ORDER — LACTATED RINGERS IV SOLN
INTRAVENOUS | Status: DC
  Administered 2019-04-23: 09:00:00 via INTRAVENOUS

## 2019-04-23 MED ORDER — POLYVINYL ALCOHOL 1.4 % OP SOLN
1.0000 [drp] | Freq: Three times a day (TID) | OPHTHALMIC | Status: DC | PRN
  Filled 2019-04-23: qty 15

## 2019-04-23 MED ORDER — FENTANYL CITRATE (PF) 100 MCG/2ML IJ SOLN
INTRAMUSCULAR | Status: AC
  Filled 2019-04-23: qty 2

## 2019-04-23 MED ORDER — OXYCODONE HCL 5 MG/5ML PO SOLN: 5.0000 mg | Freq: Once | ORAL | Status: AC | PRN

## 2019-04-23 MED ORDER — SODIUM CHLORIDE FLUSH 0.9 % IV SOLN
INTRAVENOUS | Status: AC
  Administered 2019-04-23: 09:00:00
  Filled 2019-04-23: qty 10

## 2019-04-23 MED ORDER — PROPOFOL 10 MG/ML IV BOLUS
INTRAVENOUS | Status: DC | PRN
  Administered 2019-04-23: 20 mg via INTRAVENOUS
  Administered 2019-04-23: 150 mg via INTRAVENOUS
  Administered 2019-04-23: 30 mg via INTRAVENOUS

## 2019-04-23 MED ORDER — SENNA 8.6 MG PO TABS
1.0000 | ORAL_TABLET | Freq: Two times a day (BID) | ORAL | Status: DC
  Administered 2019-04-23 – 2019-04-24 (×3): 8.6 mg via ORAL
  Filled 2019-04-23 (×3): qty 1

## 2019-04-23 MED ORDER — PAROXETINE MESYLATE 7.5 MG PO CAPS: 7.5000 mg | ORAL_CAPSULE | Freq: Every day | ORAL | Status: DC

## 2019-04-23 MED ORDER — VANCOMYCIN HCL IN DEXTROSE 1-5 GM/200ML-% IV SOLN
1000.0000 mg | Freq: Once | INTRAVENOUS | Status: AC
  Administered 2019-04-23 (×2): 1000 mg via INTRAVENOUS

## 2019-04-23 MED ORDER — FENTANYL CITRATE (PF) 100 MCG/2ML IJ SOLN
INTRAMUSCULAR | Status: AC
  Administered 2019-04-23: 50 ug via INTRAVENOUS
  Filled 2019-04-23: qty 2

## 2019-04-23 MED ORDER — FENTANYL CITRATE (PF) 100 MCG/2ML IJ SOLN
INTRAMUSCULAR | Status: DC | PRN
  Administered 2019-04-23: 100 ug via INTRAVENOUS

## 2019-04-23 MED ORDER — PNEUMOCOCCAL VAC POLYVALENT 25 MCG/0.5ML IJ INJ
0.5000 mL | INJECTION | INTRAMUSCULAR | Status: AC
  Administered 2019-04-24: 0.5 mL via INTRAMUSCULAR
  Filled 2019-04-23: qty 0.5

## 2019-04-23 MED ORDER — SODIUM CHLORIDE 0.9 % IV SOLN
250.0000 mL | INTRAVENOUS | Status: DC
  Administered 2019-04-23: 250 mL via INTRAVENOUS

## 2019-04-23 MED ORDER — DIPHENHYDRAMINE-APAP (SLEEP) 25-500 MG PO TABS: 1.0000 | ORAL_TABLET | Freq: Every evening | ORAL | Status: DC | PRN

## 2019-04-23 MED ORDER — PROPOFOL 10 MG/ML IV BOLUS
INTRAVENOUS | Status: AC
  Filled 2019-04-23: qty 20

## 2019-04-23 MED ORDER — ONDANSETRON HCL 4 MG/2ML IJ SOLN: 4.0000 mg | Freq: Four times a day (QID) | INTRAMUSCULAR | Status: DC | PRN

## 2019-04-23 MED ORDER — ACETAMINOPHEN 650 MG RE SUPP: 650.0000 mg | RECTAL | Status: DC | PRN

## 2019-04-23 MED ORDER — MIDAZOLAM HCL 2 MG/2ML IJ SOLN
INTRAMUSCULAR | Status: DC | PRN
  Administered 2019-04-23: 2 mg via INTRAVENOUS

## 2019-04-23 MED ORDER — THROMBIN 5000 UNITS EX SOLR
CUTANEOUS | Status: DC | PRN
  Administered 2019-04-23: 5000 [IU] via TOPICAL

## 2019-04-23 MED ORDER — METHOCARBAMOL 500 MG PO TABS
500.0000 mg | ORAL_TABLET | Freq: Four times a day (QID) | ORAL | Status: DC | PRN
  Filled 2019-04-23: qty 1

## 2019-04-23 MED ORDER — DOCUSATE SODIUM 100 MG PO CAPS
100.0000 mg | ORAL_CAPSULE | Freq: Two times a day (BID) | ORAL | Status: DC
  Administered 2019-04-23 – 2019-04-24 (×2): 100 mg via ORAL
  Filled 2019-04-23: qty 1

## 2019-04-23 MED ORDER — SODIUM CHLORIDE 0.9% FLUSH
INTRAVENOUS | Status: DC | PRN
  Administered 2019-04-23: 10 mL

## 2019-04-23 MED ORDER — HYDROMORPHONE HCL 1 MG/ML IJ SOLN: 0.5000 mg | INTRAMUSCULAR | Status: DC | PRN

## 2019-04-23 MED ORDER — ONDANSETRON HCL 4 MG/2ML IJ SOLN
INTRAMUSCULAR | Status: DC | PRN
  Administered 2019-04-23: 4 mg via INTRAVENOUS

## 2019-04-23 MED ORDER — VANCOMYCIN HCL IN DEXTROSE 1-5 GM/200ML-% IV SOLN
1000.0000 mg | Freq: Once | INTRAVENOUS | Status: AC
  Administered 2019-04-23: 1000 mg via INTRAVENOUS
  Filled 2019-04-23: qty 200

## 2019-04-23 MED ORDER — BUPIVACAINE-EPINEPHRINE (PF) 0.5% -1:200000 IJ SOLN
INTRAMUSCULAR | Status: DC | PRN
  Administered 2019-04-23: 4.4 mL

## 2019-04-23 MED ORDER — LEVOTHYROXINE SODIUM 88 MCG PO TABS
88.0000 ug | ORAL_TABLET | Freq: Every day | ORAL | Status: DC
  Administered 2019-04-24: 88 ug via ORAL
  Filled 2019-04-23: qty 1

## 2019-04-23 MED ORDER — LIDOCAINE HCL (CARDIAC) PF 100 MG/5ML IV SOSY
PREFILLED_SYRINGE | INTRAVENOUS | Status: DC | PRN
  Administered 2019-04-23: 90 mg via INTRAVENOUS

## 2019-04-23 MED ORDER — SODIUM CHLORIDE FLUSH 0.9 % IV SOLN
INTRAVENOUS | Status: AC
  Filled 2019-04-23: qty 10

## 2019-04-23 MED ORDER — SODIUM CHLORIDE 0.9% FLUSH
3.0000 mL | Freq: Two times a day (BID) | INTRAVENOUS | Status: DC
  Administered 2019-04-23 – 2019-04-24 (×2): 3 mL via INTRAVENOUS

## 2019-04-23 MED ORDER — INFLUENZA VAC SPLIT QUAD 0.5 ML IM SUSY
0.5000 mL | PREFILLED_SYRINGE | INTRAMUSCULAR | Status: AC
  Administered 2019-04-24: 0.5 mL via INTRAMUSCULAR
  Filled 2019-04-23: qty 0.5

## 2019-04-23 MED ORDER — DIPHENHYDRAMINE HCL 25 MG PO CAPS: 25.0000 mg | ORAL_CAPSULE | Freq: Every evening | ORAL | Status: DC | PRN

## 2019-04-23 MED ORDER — OXYCODONE HCL 5 MG PO TABS
ORAL_TABLET | ORAL | Status: AC
  Administered 2019-04-23: 5 mg via ORAL
  Filled 2019-04-23: qty 1

## 2019-04-23 MED ORDER — MENTHOL 3 MG MT LOZG
1.0000 | LOZENGE | OROMUCOSAL | Status: DC | PRN
  Filled 2019-04-23: qty 9

## 2019-04-23 MED ORDER — FENTANYL CITRATE (PF) 100 MCG/2ML IJ SOLN
25.0000 ug | INTRAMUSCULAR | Status: DC | PRN
  Administered 2019-04-23 (×2): 50 ug via INTRAVENOUS
  Administered 2019-04-23 (×2): 25 ug via INTRAVENOUS
  Administered 2019-04-23: 50 ug via INTRAVENOUS

## 2019-04-23 MED ORDER — OXYCODONE HCL 5 MG PO TABS
5.0000 mg | ORAL_TABLET | ORAL | Status: DC | PRN
  Administered 2019-04-23 (×2): 5 mg via ORAL
  Filled 2019-04-23 (×2): qty 1

## 2019-04-23 MED ORDER — ACETAMINOPHEN 500 MG PO TABS: 500.0000 mg | ORAL_TABLET | Freq: Every evening | ORAL | Status: DC | PRN

## 2019-04-23 MED ORDER — BACITRACIN 50000 UNITS IM SOLR
INTRAMUSCULAR | Status: AC
  Filled 2019-04-23: qty 1

## 2019-04-23 SURGICAL SUPPLY — 59 items
BASKET BONE COLLECTION (BASKET) IMPLANT
BULB RESERV EVAC DRAIN JP 100C (MISCELLANEOUS) IMPLANT
BUR NEURO DRILL SOFT 3.0X3.8M (BURR) ×2 IMPLANT
CANISTER SUCT 1200ML W/VALVE (MISCELLANEOUS) ×4 IMPLANT
CHLORAPREP W/TINT 26 (MISCELLANEOUS) ×4 IMPLANT
COUNTER NEEDLE 20/40 LG (NEEDLE) ×2 IMPLANT
COVER LIGHT HANDLE STERIS (MISCELLANEOUS) ×4 IMPLANT
COVER WAND RF STERILE (DRAPES) ×2 IMPLANT
CRADLE LAMINECT ARM (MISCELLANEOUS) ×2 IMPLANT
CUP MEDICINE 2OZ PLAST GRAD ST (MISCELLANEOUS) ×2 IMPLANT
DERMABOND ADVANCED (GAUZE/BANDAGES/DRESSINGS) ×1
DERMABOND ADVANCED .7 DNX12 (GAUZE/BANDAGES/DRESSINGS) ×1 IMPLANT
DRAIN CHANNEL JP 10F RND 20C F (MISCELLANEOUS) IMPLANT
DRAPE C-ARM 42X72 X-RAY (DRAPES) ×4 IMPLANT
DRAPE INCISE IOBAN 66X60 STRL (DRAPES) ×2 IMPLANT
DRAPE LAPAROTOMY 77X122 PED (DRAPES) ×2 IMPLANT
DRAPE MICROSCOPE SPINE 48X150 (DRAPES) ×2 IMPLANT
DRAPE POUCH INSTRU U-SHP 10X18 (DRAPES) ×2 IMPLANT
DRAPE SURG 17X11 SM STRL (DRAPES) ×8 IMPLANT
ELECT CAUTERY BLADE TIP 2.5 (TIP) ×2
ELECT REM PT RETURN 9FT ADLT (ELECTROSURGICAL) ×2
ELECTRODE CAUTERY BLDE TIP 2.5 (TIP) ×1 IMPLANT
ELECTRODE REM PT RTRN 9FT ADLT (ELECTROSURGICAL) ×1 IMPLANT
FEE INTRAOP MONITOR IMPULS NCS (MISCELLANEOUS) ×1 IMPLANT
FRAME EYE SHIELD (PROTECTIVE WEAR) ×4 IMPLANT
GLOVE BIOGEL PI IND STRL 7.0 (GLOVE) ×1 IMPLANT
GLOVE BIOGEL PI INDICATOR 7.0 (GLOVE) ×1
GLOVE SURG SYN 7.0 (GLOVE) ×4 IMPLANT
GLOVE SURG SYN 8.5  E (GLOVE) ×3
GLOVE SURG SYN 8.5 E (GLOVE) ×3 IMPLANT
GOWN SRG XL LVL 3 NONREINFORCE (GOWNS) ×1 IMPLANT
GOWN STRL NON-REIN TWL XL LVL3 (GOWNS) ×1
GOWN STRL REUS W/TWL MED LVL3 (GOWN DISPOSABLE) ×2 IMPLANT
GRADUATE 1200CC STRL 31836 (MISCELLANEOUS) ×2 IMPLANT
INTRAOP MONITOR FEE IMPULS NCS (MISCELLANEOUS) ×1
INTRAOP MONITOR FEE IMPULSE (MISCELLANEOUS) ×1
KIT TURNOVER KIT A (KITS) ×2 IMPLANT
MARKER SKIN DUAL TIP RULER LAB (MISCELLANEOUS) ×4 IMPLANT
NDL SAFETY ECLIPSE 18X1.5 (NEEDLE) ×1 IMPLANT
NEEDLE HYPO 18GX1.5 SHARP (NEEDLE) ×1
NEEDLE HYPO 22GX1.5 SAFETY (NEEDLE) ×2 IMPLANT
NS IRRIG 1000ML POUR BTL (IV SOLUTION) ×2 IMPLANT
PACK LAMINECTOMY NEURO (CUSTOM PROCEDURE TRAY) ×2 IMPLANT
PIN CASPAR 14 (PIN) ×1 IMPLANT
PIN CASPAR 14MM (PIN) ×2
PLATE ANT CERV XTEND 1 LV 12 (Plate) ×2 IMPLANT
SCREW VAR 4.2 XD SELF DRILL 14 (Screw) ×8 IMPLANT
SPACER CERVICAL FRGE 12X14X7-7 (Spacer) ×2 IMPLANT
SPOGE SURGIFLO 8M (HEMOSTASIS) ×1
SPONGE KITTNER 5P (MISCELLANEOUS) ×2 IMPLANT
SPONGE SURGIFLO 8M (HEMOSTASIS) ×1 IMPLANT
STAPLER SKIN PROX 35W (STAPLE) IMPLANT
SUT V-LOC 90 ABS DVC 3-0 CL (SUTURE) ×2 IMPLANT
SUT VIC AB 3-0 SH 8-18 (SUTURE) ×2 IMPLANT
SYR 30ML LL (SYRINGE) ×2 IMPLANT
TAPE CLOTH 3X10 WHT NS LF (GAUZE/BANDAGES/DRESSINGS) ×2 IMPLANT
TOWEL OR 17X26 4PK STRL BLUE (TOWEL DISPOSABLE) ×6 IMPLANT
TRAY FOLEY MTR SLVR 16FR STAT (SET/KITS/TRAYS/PACK) IMPLANT
TUBING CONNECTING 10 (TUBING) ×2 IMPLANT

## 2019-04-23 NOTE — Progress Notes (Signed)
Called Dr Izora Ribas to clarify iv fluids order. Received new order to run fluids at 75cc hr

## 2019-04-23 NOTE — Consult Note (Signed)
Pharmacy Antibiotic Note  Kathryn Whitehead is a 63 y.o. female admitted on (Not on file) with surgical prophylaxis.  Pharmacy has been consulted for Vancomycin dosing.  Plan: One time dose of 1000mg  IV Vancomycin started within 60-120 minutes prior to surgical incision.     No data recorded.  Recent Labs  Lab 04/16/19 1200  WBC 6.1  CREATININE 0.69    Estimated Creatinine Clearance: 60.7 mL/min (by C-G formula based on SCr of 0.69 mg/dL).    Allergies  Allergen Reactions  . Amlodipine Itching  . Montelukast Itching  . Ampicillin Rash    Antimicrobials this admission: N/A  Dose adjustments this admission: N/A  Microbiology results: COVID NEG  Thank you for allowing pharmacy to be a part of this patient's care.  Lu Duffel, PharmD, BCPS Clinical Pharmacist 04/23/2019 7:37 AM;

## 2019-04-23 NOTE — H&P (Signed)
I have reviewed and confirmed my history and physical from 04/10/2019 with no additions or changes. Plan for C4-5 ACDF.  Risks and benefits reviewed.  Heart sounds normal no MRG. Chest Clear to Auscultation Bilaterally.

## 2019-04-23 NOTE — Op Note (Signed)
Indications: Kathryn Whitehead is a 63 yo female who presented with progressive cervical spondylotic myelopathy.  Due to worsening symptoms, she was advised for surgery.  Findings: severe stenosis C4-5  Preoperative Diagnosis: Cervical myelopathy Postoperative Diagnosis: same   EBL: 25 ml IVF: 600 ml Drains: none Disposition: Extubated and Stable to PACU Complications: none  No foley catheter was placed.   Preoperative Note:   Risks of surgery discussed include: infection, bleeding, stroke, coma, death, paralysis, CSF leak, nerve/spinal cord injury, numbness, tingling, weakness, complex regional pain syndrome, recurrent stenosis and/or disc herniation, vascular injury, development of instability, neck/back pain, need for further surgery, persistent symptoms, development of deformity, and the risks of anesthesia. The patient understood these risks and agreed to proceed.  Procedure:  1) Anterior cervical diskectomy and fusion at C4-5 2) Anterior cervical instrumentation at C4-5 using Globus Xtend 3) Structural allograft consisting of corticocancellous allograft   Procedure: After obtaining informed consent, the patient taken to the operating room, placed in supine position, general anesthesia induced.  The patient had a small shoulder roll placed behind their shoulders.  The patient received preop antibiotics and IV Decadron.  The patient had a neck incision outlined, was prepped and draped in usual sterile fashion. The incision was injected with local anesthetic.   An incision was opened, dissection taken down medial to the carotid artery and jugular vein, lateral to the trachea and esophagus.  The prevertebral fascia identified and a localizing x-ray demonstrated the correct level.  The longus colli were dissected laterally, and self-retaining retractors placed to open the operative field. The microscope was then brought into the field.  With this complete, distractor pins were placed in the  vertebral bodies of C4 and C5. The distractor was placed, and the anulus at C4/5 was opened using a bovie.  Curettes and pituitary rongeurs used to remove the majority of disk, then the drill was used to remove the posterior osteophyte and begin the foraminotomies. Severe central stenosis was noted. The nerve hook was used to elevate the posterior longitudinal ligament, which was then removed with Kerrison rongeurs. The microblunt nerve hook could be passed out the foramen bilaterally.   Meticulous hemostasis obtained.  Structural allograft was tapped behind the anterior lip of the vertebral body at C4/5 (7 mm).    The caspar distractor was removed, and bone wax used for hemostasis. A separate, 12 mm Globus Xtend plate was chosen.  Two screws placed in each vertebral body, respectively making sure the screws were behind the locking mechanism.  Final AP and lateral radiographs were taken.   With everything in good position, the wound was irrigated copiously with bacitracin-containing solution and meticulous hemostasis obtained.  Wound was closed in 2 layers using interrupted inverted 3-0 Vicryl sutures.  The wound was dressed with dermabond, the head of bed at 30 degrees, taken to recovery room in stable condition.  No new postop neurological deficits were identified.  Sponge and pattie counts were correct at the end of the procedure.    I performed the entire procedure with [facility] RNFA as an Pensions consultant.  Meade Maw MD

## 2019-04-23 NOTE — Anesthesia Procedure Notes (Signed)
Procedure Name: Intubation Date/Time: 04/23/2019 10:04 AM Performed by: Rona Ravens, CRNA Pre-anesthesia Checklist: Patient identified, Emergency Drugs available, Suction available, Patient being monitored and Timeout performed Patient Re-evaluated:Patient Re-evaluated prior to induction Oxygen Delivery Method: Circle system utilized Preoxygenation: Pre-oxygenation with 100% oxygen Induction Type: IV induction and Rapid sequence Laryngoscope Size: McGraph and 3 Grade View: Grade II Tube type: Oral Tube size: 7.0 mm Number of attempts: 1 Airway Equipment and Method: Bougie stylet Placement Confirmation: ETT inserted through vocal cords under direct vision,  positive ETCO2,  CO2 detector and breath sounds checked- equal and bilateral Secured at: 21 cm Tube secured with: Tape Dental Injury: Teeth and Oropharynx as per pre-operative assessment  Difficulty Due To: Difficult Airway- due to anterior larynx Future Recommendations: Recommend- induction with short-acting agent, and alternative techniques readily available

## 2019-04-23 NOTE — Anesthesia Post-op Follow-up Note (Signed)
Anesthesia QCDR form completed.        

## 2019-04-23 NOTE — Transfer of Care (Signed)
Immediate Anesthesia Transfer of Care Note  Patient: Kathryn Whitehead  Procedure(s) Performed: ANTERIOR CERVICAL DECOMPRESSION/DISCECTOMY FUSION 1 LEVEL C4-5 (N/A )  Patient Location: PACU  Anesthesia Type:General  Level of Consciousness: awake, alert  and oriented  Airway & Oxygen Therapy: Patient Spontanous Breathing and Patient connected to face mask oxygen  Post-op Assessment: Report given to RN and Post -op Vital signs reviewed and stable  Post vital signs: Reviewed and stable  Last Vitals:  Vitals Value Taken Time  BP 155/82 04/23/19 1126  Temp 36.1 C 04/23/19 1126  Pulse 64 04/23/19 1130  Resp 14 04/23/19 1130  SpO2 100 % 04/23/19 1130  Vitals shown include unvalidated device data.  Last Pain:  Vitals:   04/23/19 1126  TempSrc:   PainSc: 9       Patients Stated Pain Goal: 0 ([id number])  Complications: No apparent anesthesia complications

## 2019-04-23 NOTE — Anesthesia Preprocedure Evaluation (Signed)
Anesthesia Evaluation  Patient identified by MRN, date of birth, ID band Patient awake    Reviewed: Allergy & Precautions, H&P , NPO status , Patient's Chart, lab work & pertinent test results  History of Anesthesia Complications Negative for: history of anesthetic complications  Airway Mallampati: II  TM Distance: >3 FB Neck ROM: full    Dental  (+) Chipped   Pulmonary neg pulmonary ROS, neg shortness of breath,           Cardiovascular Exercise Tolerance: Good hypertension, (-) angina(-) Past MI and (-) DOE      Neuro/Psych negative neurological ROS  negative psych ROS   GI/Hepatic Neg liver ROS, GERD  Medicated and Controlled,  Endo/Other  Hypothyroidism   Renal/GU      Musculoskeletal   Abdominal   Peds  Hematology negative hematology ROS (+)   Anesthesia Other Findings Past Medical History: No date: GERD (gastroesophageal reflux disease) No date: Hypercholesterolemia No date: Hypertension No date: Hypothyroidism No date: Thyroid disease  Past Surgical History: 1994: BACK SURGERY     Comment:  herniated disc removed lumbar area ([place]) 1983: CESAREAN SECTION No date: COLONOSCOPY No date: TONSILLECTOMY No date: TONSILLECTOMY AND ADENOIDECTOMY     Comment:  teenager No date: TUBAL LIGATION  BMI    Body Mass Index: 27.54 kg/m      Reproductive/Obstetrics negative OB ROS                             Anesthesia Physical Anesthesia Plan  ASA: III  Anesthesia Plan: General ETT   Post-op Pain Management:    Induction: Intravenous  PONV Risk Score and Plan: Ondansetron, Dexamethasone, Midazolam and Treatment may vary due to age or medical condition  Airway Management Planned: Oral ETT and Video Laryngoscope Planned  Additional Equipment:   Intra-op Plan:   Post-operative Plan: Extubation in OR  Informed Consent: I have reviewed the patients History and  Physical, chart, labs and discussed the procedure including the risks, benefits and alternatives for the proposed anesthesia with the patient or authorized representative who has indicated his/her understanding and acceptance.     Dental Advisory Given  Plan Discussed with: Anesthesiologist, CRNA and Surgeon  Anesthesia Plan Comments: (Patient consented for risks of anesthesia including but not limited to:  - adverse reactions to medications - damage to teeth, lips or other oral mucosa - sore throat or hoarseness - Damage to heart, brain, lungs or loss of life  Patient voiced understanding.)        Anesthesia Quick Evaluation

## 2019-04-24 ENCOUNTER — Encounter: Payer: Self-pay | Admitting: Neurosurgery

## 2019-04-24 DIAGNOSIS — M4712 Other spondylosis with myelopathy, cervical region: Secondary | ICD-10-CM | POA: Diagnosis not present

## 2019-04-24 MED ORDER — CELECOXIB 200 MG PO CAPS: 200.0000 mg | ORAL_CAPSULE | Freq: Two times a day (BID) | ORAL | 0 refills | Status: AC

## 2019-04-24 MED ORDER — ACETAMINOPHEN 325 MG PO TABS: 650.0000 mg | ORAL_TABLET | ORAL | Status: AC | PRN

## 2019-04-24 MED ORDER — OXYCODONE HCL 5 MG PO TABS: 5.0000 mg | ORAL_TABLET | ORAL | 0 refills | Status: AC | PRN

## 2019-04-24 MED ORDER — METHOCARBAMOL 500 MG PO TABS: 500.0000 mg | ORAL_TABLET | Freq: Four times a day (QID) | ORAL | 0 refills | Status: AC | PRN

## 2019-04-24 NOTE — Evaluation (Signed)
Occupational Therapy Evaluation Patient Details Name: Kathryn Whitehead MRN: [id number] DOB: Feb 07, 1956 Today's Date: 04/24/2019    History of Present Illness [name] was admitted to the hospital for cervical myelopathy.  She underwent  s/p ACDF  C4-5 anterior cervical discectomy and fusion, and is POD1.   Clinical Impression   Pt is 63 year old female s/p ACDF C4-5.  Pt was independent in all ADLs prior to surgery, working full time at [employer] as Engineer, structural.  She is eager to return to PLOF.  Pt currently requires min assist for LB dressing while in seated position due to pain. Pain level was 2/10 throughout session but cooperative and motivated to regain independence in ADLs.  Pt has improved tingling in hands especially R which is her dominant hand.  Reviewed rec for AD for LB dressing and completed with min assist and cues and reviewed precautions when transferring to and from toilet and couch or chair at home.   Pt would benefit from instruction in dressing techniques with or without assistive devices for dressing and bathing skills.  Rec a shower chair and grab bars in shower, reacher, and sock aid, LH shoe horn.  Also rec she ask [facility] about ergonomics assessment to ensure she does not have strain in neck when at her desk and using the computer.  No further OT rec after discharge.     Follow Up Recommendations  No OT follow up    Equipment Recommendations  Tub/shower seat    Recommendations for Other Services       Precautions / Restrictions Precautions Precautions: Fall Restrictions Weight Bearing Restrictions: No      Mobility Bed Mobility                  Transfers                      Balance                                           ADL either performed or assessed with clinical judgement   ADL Overall ADL's : Needs assistance/impaired Eating/Feeding: Set up;Independent   Grooming: Wash/dry hands;Wash/dry face;Oral  care;Brushing hair;Set up;Standing   Upper Body Bathing: Independent;Set up   Lower Body Bathing: Minimal assistance;Set up   Upper Body Dressing : Independent;Set up Upper Body Dressing Details (indicate cue type and reason): able to complete buttons and zippers despite tingling in hands but needs more time esp with jeans Lower Body Dressing: Minimal assistance;Set up;Sit to/from stand Lower Body Dressing Details (indicate cue type and reason): Pt did well with using AD for LB dressing skills and rec reacher, sock and shoe horn to prevent any pushing and pulling but Dr Cari Caraway did not state any precautions. Toilet Transfer: Min guard;Set up Toilet Transfer Details (indicate cue type and reason): no AD with minimal sway in gait as she walked back to recliner but no LOB Toileting- Clothing Manipulation and Hygiene: Independent         General ADL Comments: Pt educated in AD for LB dressing skills to prevent any increase in pulling or strain during ADLs.     Vision Patient Visual Report: No change from baseline       Perception     Praxis      Pertinent Vitals/Pain Pain Assessment: 0-10 Pain Score: 2  Pain Location: posterior neck  Pain Descriptors / Indicators: Discomfort Pain Intervention(s): Monitored during session     Hand Dominance Right   Extremity/Trunk Assessment Upper Extremity Assessment Upper Extremity Assessment: Overall WFL for tasks assessed(tingling in B hands but improved since surgery esp in R)   Lower Extremity Assessment Lower Extremity Assessment: Defer to PT evaluation   Cervical / Trunk Assessment Cervical / Trunk Assessment: Normal   Communication Communication Communication: No difficulties   Cognition Arousal/Alertness: Awake/alert Behavior During Therapy: WFL for tasks assessed/performed Overall Cognitive Status: Within Functional Limits for tasks assessed                                     General Comments        Exercises     Shoulder Instructions      Home Living Family/patient expects to be discharged to:: Private residence Living Arrangements: Spouse/significant other Available Help at Discharge: Family;Available 24 hours/day Type of Home: Mobile home Home Access: Ramped entrance     Home Layout: One level     Bathroom Shower/Tub: Teacher, early years/pre: Standard Bathroom Accessibility: No   Home Equipment: None          Prior Functioning/Environment Level of Independence: Independent        Comments: lumbar DDD but able to perform all ADL/IADL communicty distance AMB; no falls or baalnce issues at baseline. 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        OT Problem List: Decreased strength;Decreased range of motion;Decreased activity tolerance;Pain;Impaired balance (sitting and/or standing)      OT Treatment/Interventions: Self-care/ADL training;Balance training;DME and/or AE instruction    OT Goals(Current goals can be found in the care plan section) Acute Rehab OT Goals Patient Stated Goal: to go home today OT Goal Formulation: With patient Time For Goal Achievement: 05/01/19 Potential to Achieve Goals: Good ADL Goals Pt Will Perform Lower Body Dressing: with set-up;Independently;with adaptive equipment;sit to/from stand  OT Frequency: Min 1X/week   Barriers to D/C:            Co-evaluation              AM-PAC OT "6 Clicks" Daily Activity     Outcome Measure Help from another person eating meals?: None Help from another person taking care of personal grooming?: None Help from another person toileting, which includes using toliet, bedpan, or urinal?: None Help from another person bathing (including washing, rinsing, drying)?: A Little Help from another person to put on and taking off regular upper body clothing?: None Help from another person to put on and taking off regular lower body clothing?: A Little 6 Click Score: 22   End of  Session Equipment Utilized During Treatment: Gait belt  Activity Tolerance: Patient tolerated treatment well Patient left: in chair;with chair alarm set;with call bell/phone within reach  OT Visit Diagnosis: Unsteadiness on feet (R26.81);Pain Pain - Right/Left: Left Pain - part of body: (post neck)                Time: [id number] OT Time Calculation (min): 35 min Charges:  OT General Charges $OT Visit: 1 Visit OT Evaluation $OT Eval Low Complexity: 1 Low OT Treatments $Self Care/Home Management : 8-22 mins  Chrys Racer, OTR/L, [place] ascom [phone] 04/24/19, 10:12 AM

## 2019-04-24 NOTE — Progress Notes (Signed)
    Attending Progress Note  History: Kathryn Whitehead is here for cervical myelopathy, POD0 s/p ACDF C4-5.  She was seen in the radiology suite - no complaints noted other than expected post-op pain.  Physical Exam: Vitals:   04/24/19 0438 04/24/19 0753  BP: 129/73 122/70  Pulse: 62 (!) 58  Resp: 16 16  Temp: 98.6 F (37 C) 98.1 F (36.7 C)  SpO2: 97% 98%    AA Ox3 CNI  Strength:5/5 throughout BUE Incision c.d.i  Data:  Recent Labs  Lab 04/23/19 0822  NA 140  K 3.5  CL 104  BUN 14  CREATININE 0.60  GLUCOSE 105*   No results for input(s): AST, ALT, ALKPHOS in the last 168 hours.  Invalid input(s): TBILI   Recent Labs  Lab 04/23/19 0822  HGB 14.3  HCT 42.0   No results for input(s): APTT, INR in the last 168 hours.       Other tests/results: cervical spine xrays - good placement of C4-5 ACDF  Assessment/Plan:  Kathryn Whitehead is doing well POD0 s/p ACDF C4-5  - mobilize - pain control - DVT prophylaxis SCDs   Meade Maw MD, [facility] Department of Neurosurgery

## 2019-04-24 NOTE — Discharge Summary (Signed)
Physician Discharge Summary  Patient ID: Kathryn Whitehead MRN: [id number] DOB/AGE: 1956-05-09 63 y.o.  Admit date: 04/23/2019 Discharge date: 04/24/2019  Admission Diagnoses: Cervical myelopathy  Discharge Diagnoses:  Active Problems:   Cervical myelopathy [facility])   Discharged Condition: good  Hospital Course: Kathryn Whitehead was admitted to the hospital for cervical myelopathy.  She underwent C4-5 anterior cervical discectomy and fusion, and did very well.  She was ambulatory and had pain under control on POD1.  She was felt to be stable for discharge.  Consults: None  Significant Diagnostic Studies: radiology: X-Ray: good placement of C4-5 ACDF  Treatments: surgery: C4-5 ACDF  Discharge Exam: Blood pressure 122/70, pulse (!) 58, temperature 98.1 F (36.7 C), temperature source Oral, resp. rate 16, height 5' (1.524 m), weight 64 kg, SpO2 98 %. General appearance: alert and cooperative  CNI 5/5 throughout Neck soft, some expected bruising and swelling post-op  Disposition: Home  Discharge Instructions    Incentive spirometry RT   Complete by: As directed      Allergies as of 04/24/2019      Reactions   Amlodipine Itching   Montelukast Itching   Ampicillin Rash      Medication List    TAKE these medications   acetaminophen 325 MG tablet Commonly known as: TYLENOL Take 2 tablets (650 mg total) by mouth every 4 (four) hours as needed for mild pain ((score 1 to 3) or temp > 100.5).   calcium carbonate 1500 (600 Ca) MG Tabs tablet Commonly known as: OSCAL Take 600 mg of elemental calcium by mouth daily with breakfast.   celecoxib 200 MG capsule Commonly known as: CELEBREX Take 1 capsule (200 mg total) by mouth every 12 (twelve) hours.   cholecalciferol 25 MCG (1000 UT) tablet Commonly known as: VITAMIN D3 Take 1,000 Units by mouth daily.   diphenhydramine-acetaminophen 25-500 MG Tabs tablet Commonly known as: TYLENOL PM Take 1-2 tablets by mouth at bedtime as  needed (pain/sleep).   ESTROVEN PO Take 1 tablet by mouth daily.   hydrALAZINE 50 MG tablet Commonly known as: APRESOLINE Take 50 mg by mouth 2 (two) times daily.   hydrochlorothiazide 25 MG tablet Commonly known as: HYDRODIURIL Take 25 mg by mouth daily.   hydroxypropyl methylcellulose / hypromellose 2.5 % ophthalmic solution Commonly known as: ISOPTO TEARS / GONIOVISC Place 1 drop into both eyes 3 (three) times daily as needed for dry eyes.   levothyroxine 88 MCG tablet Commonly known as: SYNTHROID Take 88 mcg by mouth daily before breakfast.   methocarbamol 500 MG tablet Commonly known as: ROBAXIN Take 1 tablet (500 mg total) by mouth every 6 (six) hours as needed for muscle spasms.   oxyCODONE 5 MG immediate release tablet Commonly known as: Oxy IR/ROXICODONE Take 1 tablet (5 mg total) by mouth every 3 (three) hours as needed for moderate pain ((score 4 to 6)).   pantoprazole 40 MG tablet Commonly known as: PROTONIX Take 40 mg by mouth daily.   PARoxetine Mesylate 7.5 MG Caps Take 7.5 mg by mouth daily.   simvastatin 10 MG tablet Commonly known as: ZOCOR Take 10 mg by mouth at bedtime.        SignedMeade Maw 04/24/2019, 8:08 AM

## 2019-04-24 NOTE — TOC Progression Note (Signed)
Transition of Care [facility]) - Progression Note    Patient Details  Name: Kathryn Whitehead MRN: [id number] Date of Birth: 19-Jul-1955  Transition of Care [facility]) CM/SW [place], RN Phone Number: 04/24/2019, 2:19 PM  Clinical Narrative:     Spoke with Physician about the PT recommendation.  He stated to send the patient home and they will follow up and see what she needs when they have the follow up visit.       Expected Discharge Plan and Services           Expected Discharge Date: 04/24/19                                     Social Determinants of Health (SDOH) Interventions    Readmission Risk Interventions No flowsheet data found.

## 2019-04-24 NOTE — Progress Notes (Signed)
    Attending Progress Note  History: Kathryn Whitehead is here for cervical myelopathy, POD1 s/p ACDF C4-5.  She had a good night overnight.  Pain is manageable.  She notes improvement in the tingling in her R hand.  L hand numbness continues.  Swallowing well.   Physical Exam: Vitals:   04/24/19 0438 04/24/19 0753  BP: 129/73 122/70  Pulse: 62 (!) 58  Resp: 16 16  Temp: 98.6 F (37 C) 98.1 F (36.7 C)  SpO2: 97% 98%    AA Ox3 CNI  Strength:5/5 throughout BUE Incision c.d.i with some bruising  She does have some swelling around incision, but trachea midline and voice strong.  No issues swallowing.  Data:  Recent Labs  Lab 04/23/19 0822  NA 140  K 3.5  CL 104  BUN 14  CREATININE 0.60  GLUCOSE 105*   No results for input(s): AST, ALT, ALKPHOS in the last 168 hours.  Invalid input(s): TBILI   Recent Labs  Lab 04/23/19 0822  HGB 14.3  HCT 42.0   No results for input(s): APTT, INR in the last 168 hours.       Other tests/results: cervical spine xrays - good placement of C4-5 ACDF  Assessment/Plan:  Kathryn Whitehead is doing well POD1 s/p ACDF C4-5  - mobilize - pain control - DVT prophylaxis SCDs   Kathryn Maw MD, [facility] Department of Neurosurgery

## 2019-04-24 NOTE — Progress Notes (Signed)
Discharge summary reviewed with verbal understanding. Rxs given upon discharge. Escorted to personal vehicle.

## 2019-04-24 NOTE — Discharge Instructions (Signed)
Your surgeon has performed an operation on your cervical spine (neck) to relieve pressure on the spinal cord and/or nerves. This involved making an incision in the front of your neck and removing one or more of the discs that support your spine. Next, a small piece of bone, a titanium plate, and screws were used to fuse two or more of the vertebrae (bones) together.  The following are instructions to help in your recovery once you have been discharged from the hospital. Even if you feel well, it is important that you follow these activity guidelines. If you do not let your neck heal properly from the surgery, you can increase the chance of return of your symptoms and other complications.  * Do not take ibuprofen (motrin, advil) or naproxen (aleve) for 3 months after surgery. These medications can prevent your bones from healing properly. Celebrex is fine to take.  Activity    No bending, lifting, or twisting (BLT). Avoid lifting objects heavier than 10 pounds (gallon milk jug).  Where possible, avoid household activities that involve lifting, bending, reaching, pushing, or pulling such as laundry, vacuuming, grocery shopping, and childcare. Try to arrange for help from friends and family for these activities while your back heals.  Increase physical activity slowly as tolerated.  Taking short walks is encouraged, but avoid strenuous exercise. Do not jog, run, bicycle, lift weights, or participate in any other exercises unless specifically allowed by your doctor.  Talk to your doctor before resuming sexual activity.  You should not drive until cleared by your doctor.  Until released by your doctor, you should not return to work or school.  You should rest at home and let your body heal.   You may shower three days after your surgery.  After showering, lightly dab your incision dry. Do not take a tub bath or go swimming until approved by your doctor at your follow-up appointment.  If your  doctor ordered a cervical collar (neck brace) for you, you should wear it whenever you are out of bed. You may remove it when lying down or sleeping, but you should wear it at all other times. Not all neck surgeries require a cervical collar.  If you smoke, we strongly recommend that you quit.  Smoking has been proven to interfere with normal bone healing and will dramatically reduce the success rate of your surgery. Please contact [facility] (800-QUIT-NOW) and use the resources at www.[facility].com for assistance in stopping smoking.  Surgical Incision   If you have a dressing on your incision, you may remove it two days after your surgery. Keep your incision area clean and dry.  If you have staples or stitches on your incision, you should have a follow up scheduled for removal. If you do not have staples or stitches, you will have steri-strips (small pieces of surgical tape) or Dermabond glue. The steri-strips/glue should begin to peel away within about a week (it is fine if the steri-strips fall off before then). If the strips are still in place one week after your surgery, you may gently remove them.  Diet           You may return to your usual diet. However, you may experience discomfort when swallowing in the first month after your surgery. This is normal. You may find that softer foods are more comfortable for you to swallow. Be sure to stay hydrated.  When to Contact us  You may experience pain in your neck and/or pain between your  shoulder blades. This is normal and should improve in the next few weeks with the help of pain medication, muscle relaxers, and rest. Some patients report that a warm compress on the back of the neck or between the shoulder blades helps.  However, should you experience any of the following, contact us immediately:  New numbness or weakness  Pain that is progressively getting worse, and is not relieved by your pain medication, muscle relaxers, rest, and warm  compresses  Bleeding, redness, swelling, pain, or drainage from surgical incision  Chills or flu-like symptoms  Fever greater than 101.0 F (38.3 C)  Inability to eat, drink fluids, or take medications  Problems with bowel or bladder functions  Difficulty breathing or shortness of breath  Warmth, tenderness, or swelling in your calf Contact Information  During office hours (Monday-Friday 9 am to 5 pm), please call your physician at [phone] and ask for Berdine Addison  After hours and weekends, please call the [place] Operator at [phone] and ask for the Neurosurgery Resident On Call   For a life-threatening emergency, call 911

## 2019-04-24 NOTE — Evaluation (Addendum)
Physical Therapy Evaluation Patient Details Name: Kathryn Whitehead MRN: [id number] DOB: 21-May-1956 Today's Date: 04/24/2019   History of Present Illness  Ms.Hansen was admitted to the hospital for cervical myelopathy.  She underwent  s/p ACDF  C4-5 anterior cervical discectomy and fusion, and is POD1.  Clinical Impression  Pt admitted with above diagnosis. Pt currently with functional limitations due to the deficits listed below (see "PT Problem List"). Upon entry, pt in chair, awake and agreeable to participate. OT eval recently completed. The pt is alert and oriented x3, pleasant, conversational, and generally a good historian. Pt AMB to rehab gym, completes 8 stairs (4 without use of hands). Pt struggles with hands free performance as per baseline, author recommend standby assist from boyfriend at DC for home entry. Pt requires rest prior to AMB return to room. Functional mobility assessment demonstrates increased effort/time requirements, poor tolerance, but no need for physical assistance, whereas the patient performed these at a higher level of independence PTA. Pt attributes instability and quick onset fatigue to being in "bed for so long," however, pt has only been in bed for surgery and postoperative interval of less than 24 hours, hence these findings are not typical. Pt would benefit from HHPT to address instability issues and provide monitoring of this presentation. Pt will benefit from skilled PT intervention to increase independence and safety with basic mobility in preparation for discharge to the venue listed below.       Follow Up Recommendations Home health PT;Follow surgeon's recommendation for DC plan and follow-up therapies;Other (comment)(to assess acute gait instability and quick onset fatigue)    Equipment Recommendations  None recommended by PT    Recommendations for Other Services Rehab consult     Precautions / Restrictions Precautions Precautions:  Fall Restrictions Weight Bearing Restrictions: No      Mobility  Bed Mobility               General bed mobility comments: up in chair at entry  Transfers Overall transfer level: Needs assistance Equipment used: None Transfers: Sit to/from Stand Sit to Stand: Supervision            Ambulation/Gait   Gait Distance (Feet): 160 Feet(2x118ft seated rest between) Assistive device: None   Gait velocity: 0.49m.s      Stairs Stairs: Yes   Stair Management: No rails;One rail Right Number of Stairs: 4(4x2)    Wheelchair Mobility    Modified Rankin (Stroke Patients Only)       Balance                                             Pertinent Vitals/Pain Pain Assessment: 0-10 Pain Score: 2  Pain Location: posterior neck Pain Descriptors / Indicators: Discomfort Pain Intervention(s): Monitored during session    Home Living Family/patient expects to be discharged to:: Private residence Living Arrangements: Spouse/significant other Available Help at Discharge: Family;Available 24 hours/day Type of Home: Mobile home Home Access: Ramped entrance     Home Layout: One level Home Equipment: None      Prior Function Level of Independence: Independent         Comments: lumbar DDD but able to perform all ADL/IADL communicty distance AMB; no falls or baalnce issues at baseline. 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     Hand Dominance   Dominant Hand: Right    Extremity/Trunk  Assessment   Upper Extremity Assessment Upper Extremity Assessment: Overall WFL for tasks assessed(tingling in B hands but improved since surgery esp in R)    Lower Extremity Assessment Lower Extremity Assessment: Defer to PT evaluation    Cervical / Trunk Assessment Cervical / Trunk Assessment: Normal  Communication   Communication: No difficulties  Cognition Arousal/Alertness: Awake/alert Behavior During Therapy: WFL for tasks  assessed/performed Overall Cognitive Status: Within Functional Limits for tasks assessed                                        General Comments      Exercises     Assessment/Plan    PT Assessment Patient needs continued PT services  PT Problem List Decreased strength;Decreased activity tolerance;Decreased mobility;Decreased balance       PT Treatment Interventions Balance training;Therapeutic exercise;Patient/family education    PT Goals (Current goals can be found in the Care Plan section)  Acute Rehab PT Goals Patient Stated Goal: to go home today PT Goal Formulation: With patient Time For Goal Achievement: 05/08/19 Potential to Achieve Goals: Good    Frequency 7X/week   Barriers to discharge        Co-evaluation               AM-PAC PT "6 Clicks" Mobility  Outcome Measure Help needed turning from your back to your side while in a flat bed without using bedrails?: None Help needed moving from lying on your back to sitting on the side of a flat bed without using bedrails?: None Help needed moving to and from a bed to a chair (including a wheelchair)?: None Help needed standing up from a chair using your arms (e.g., wheelchair or bedside chair)?: None Help needed to walk in hospital room?: A Little Help needed climbing 3-5 steps with a railing? : A Little 6 Click Score: 22    End of Session Equipment Utilized During Treatment: Gait belt Activity Tolerance: Patient tolerated treatment well;No increased pain;Patient limited by fatigue Patient left: in chair;with call bell/phone within reach;with chair alarm set Nurse Communication: Mobility status PT Visit Diagnosis: Unsteadiness on feet (R26.81);Other abnormalities of gait and mobility (R26.89)    Time: [id number] PT Time Calculation (min) (ACUTE ONLY): 19 min   Charges:   PT Evaluation $PT Eval Low Complexity: 1 Low          12:37 PM, 04/24/19 Etta Grandchild, PT, DPT Physical  Therapist - [facility]  [phone] ([place])   [place] 04/24/2019, 12:34 PM

## 2019-04-25 NOTE — Anesthesia Postprocedure Evaluation (Signed)
Anesthesia Post Note  Patient: Kathryn Whitehead  Procedure(s) Performed: ANTERIOR CERVICAL DECOMPRESSION/DISCECTOMY FUSION 1 LEVEL C4-5 (N/A )  Patient location during evaluation: PACU Anesthesia Type: General Level of consciousness: awake and alert and oriented Pain management: pain level controlled Vital Signs Assessment: post-procedure vital signs reviewed and stable Respiratory status: spontaneous breathing Cardiovascular status: blood pressure returned to baseline Anesthetic complications: no     Last Vitals:  Vitals:   04/24/19 0438 04/24/19 0753  BP: 129/73 122/70  Pulse: 62 (!) 58  Resp: 16 16  Temp: 37 C 36.7 C  SpO2: 97% 98%    Last Pain:  Vitals:   04/24/19 0819  TempSrc:   PainSc: 3                  Haeley Fordham

## 2019-07-04 ENCOUNTER — Emergency Department: Payer: 59

## 2019-07-04 ENCOUNTER — Encounter: Payer: Self-pay | Admitting: Emergency Medicine

## 2019-07-04 ENCOUNTER — Emergency Department
Admission: EM | Admit: 2019-07-04 | Discharge: 2019-07-04 | Disposition: A | Discharge status: Home / Self Care | Payer: 59 | Attending: Emergency Medicine | Admitting: Emergency Medicine

## 2019-07-04 ENCOUNTER — Other Ambulatory Visit: Payer: Self-pay

## 2019-07-04 DIAGNOSIS — E039 Hypothyroidism, unspecified: Secondary | ICD-10-CM | POA: Insufficient documentation

## 2019-07-04 DIAGNOSIS — J028 Acute pharyngitis due to other specified organisms: Secondary | ICD-10-CM

## 2019-07-04 DIAGNOSIS — Z79899 Other long term (current) drug therapy: Secondary | ICD-10-CM | POA: Diagnosis not present

## 2019-07-04 DIAGNOSIS — R05 Cough: Secondary | ICD-10-CM | POA: Diagnosis present

## 2019-07-04 DIAGNOSIS — B9789 Other viral agents as the cause of diseases classified elsewhere: Secondary | ICD-10-CM

## 2019-07-04 DIAGNOSIS — U071 COVID-19: Secondary | ICD-10-CM | POA: Insufficient documentation

## 2019-07-04 DIAGNOSIS — I10 Essential (primary) hypertension: Secondary | ICD-10-CM | POA: Insufficient documentation

## 2019-07-04 DIAGNOSIS — J069 Acute upper respiratory infection, unspecified: Secondary | ICD-10-CM

## 2019-07-04 LAB — GROUP A STREP BY PCR: Group A Strep by PCR: NOT DETECTED

## 2019-07-04 MED ORDER — PSEUDOEPH-BROMPHEN-DM 30-2-10 MG/5ML PO SYRP: 5.0000 mL | ORAL_SOLUTION | Freq: Four times a day (QID) | ORAL | 0 refills | Status: AC | PRN

## 2019-07-04 NOTE — ED Triage Notes (Signed)
Presents with sore throat,runny nose,cough  States sx's started couple of days ago  Also has a h/a

## 2019-07-04 NOTE — Discharge Instructions (Addendum)
Follow discharge care instruction take medication as directed.  You may view the "MyChart app" for your COVID-19 test results.  Advised self quarantine pending results.

## 2019-07-04 NOTE — ED Provider Notes (Signed)
[facility] Emergency Department Provider Note   ____________________________________________   First MD Initiated Contact with Patient 07/04/19 1405     (approximate)  I have reviewed the triage vital signs and the nursing notes.   HISTORY  Chief Complaint URI    HPI Kathryn Whitehead is a 63 y.o. female patient complain of sore throat, runny nose, and nonproductive cough for few days.  Patient also complained of frontal headache.  Patient denies recent travel or known contact with COVID-19.  Patient unsure of fever.  Patient denies chest pain, nausea, vomiting, diarrhea.  No palliative measure for complaint.         Past Medical History:  Diagnosis Date  . Difficult intubation   . GERD (gastroesophageal reflux disease)   . Hypercholesterolemia   . Hypertension   . Hypothyroidism   . Thyroid disease     Patient Active Problem List   Diagnosis Date Noted  . Cervical myelopathy ([place]) 04/23/2019    Past Surgical History:  Procedure Laterality Date  . ANTERIOR CERVICAL DECOMP/DISCECTOMY FUSION N/A 04/23/2019   Procedure: ANTERIOR CERVICAL DECOMPRESSION/DISCECTOMY FUSION 1 LEVEL C4-5;  Surgeon: Meade Maw, MD;  Location: [facility] ORS;  Service: Neurosurgery;  Laterality: N/A;  . BACK SURGERY  1994   herniated disc removed lumbar area [facility])  . CESAREAN SECTION  1983  . COLONOSCOPY    . TONSILLECTOMY    . TONSILLECTOMY AND ADENOIDECTOMY     teenager  . TUBAL LIGATION      Prior to Admission medications   Medication Sig Start Date End Date Taking? Authorizing Provider  acetaminophen (TYLENOL) 325 MG tablet Take 2 tablets (650 mg total) by mouth every 4 (four) hours as needed for mild pain ((score 1 to 3) or temp > 100.5). 04/24/19   Meade Maw, MD  brompheniramine-pseudoephedrine-DM 30-2-10 MG/5ML syrup Take 5 mLs by mouth 4 (four) times daily as needed. 07/04/19   Sable Feil, PA-C  calcium carbonate (OSCAL) 1500 (600  Ca) MG TABS tablet Take 600 mg of elemental calcium by mouth daily with breakfast.    [provider]  celecoxib (CELEBREX) 200 MG capsule Take 1 capsule (200 mg total) by mouth every 12 (twelve) hours. 04/24/19   Meade Maw, MD  cholecalciferol (VITAMIN D3) 25 MCG (1000 UT) tablet Take 1,000 Units by mouth daily.    [provider]  diphenhydramine-acetaminophen (TYLENOL PM) 25-500 MG TABS tablet Take 1-2 tablets by mouth at bedtime as needed (pain/sleep).    [provider]  hydrALAZINE (APRESOLINE) 50 MG tablet Take 50 mg by mouth 2 (two) times daily. 02/22/19   [provider]  hydrochlorothiazide (HYDRODIURIL) 25 MG tablet Take 25 mg by mouth daily. 12/27/18   [provider]  hydroxypropyl methylcellulose / hypromellose (ISOPTO TEARS / GONIOVISC) 2.5 % ophthalmic solution Place 1 drop into both eyes 3 (three) times daily as needed for dry eyes.    [provider]  levothyroxine (SYNTHROID) 88 MCG tablet Take 88 mcg by mouth daily before breakfast. 01/08/19   [provider]  methocarbamol (ROBAXIN) 500 MG tablet Take 1 tablet (500 mg total) by mouth every 6 (six) hours as needed for muscle spasms. 04/24/19   Meade Maw, MD  Nutritional Supplements (ESTROVEN PO) Take 1 tablet by mouth daily.    [provider]  oxyCODONE (OXY IR/ROXICODONE) 5 MG immediate release tablet Take 1 tablet (5 mg total) by mouth every 3 (three) hours as needed for moderate pain ((score 4 to  6)). 04/24/19   Meade Maw, MD  pantoprazole (PROTONIX) 40 MG tablet Take 40 mg by mouth daily. 02/10/19   [provider]  PARoxetine Mesylate 7.5 MG CAPS Take 7.5 mg by mouth daily. 01/22/19   [provider]  simvastatin (ZOCOR) 10 MG tablet Take 10 mg by mouth at bedtime. 01/26/19   [provider]    Allergies Amlodipine, Montelukast, and Ampicillin  Family History  Problem Relation Age of Onset  . Stroke  Mother   . Heart disease Mother   . Diabetes Father   . Throat cancer Father   . COPD Sister   . Heart failure Sister   . Lung cancer Brother   . Breast cancer Neg Hx     Social History Social History   Tobacco Use  . Smoking status: Never Smoker  . Smokeless tobacco: Never Used  Substance Use Topics  . Alcohol use: Yes    Alcohol/week: 4.0 standard drinks    Types: 4 Glasses of wine per week  . Drug use: Never    Review of Systems Constitutional: No fever/chills Eyes: No visual changes. ENT: No sore throat. Cardiovascular: Denies chest pain. Respiratory: Denies shortness of breath. Gastrointestinal: No abdominal pain.  No nausea, no vomiting.  No diarrhea.  No constipation. Genitourinary: Negative for dysuria. Musculoskeletal: Negative for back pain. Skin: Negative for rash. Neurological: Negative for headaches, focal weakness or numbness. Endocrine:  Hyperlipidemia, hypertension, hypothyroidism. Allergic/Immunilogical: See medication allergy list. ____________________________________________   PHYSICAL EXAM:  VITAL SIGNS: ED Triage Vitals  Enc Vitals Group     BP 07/04/19 1222 (!) 160/75     Pulse Rate 07/04/19 1222 73     Resp 07/04/19 1222 18     Temp 07/04/19 1222 98.8 F (37.1 C)     Temp Source 07/04/19 1222 Oral     SpO2 07/04/19 1222 96 %     Weight 07/04/19 1221 142 lb (64.4 kg)     Height 07/04/19 1221 5' (1.524 m)     Head Circumference --      Peak Flow --      Pain Score --      Pain Loc --      Pain Edu? --      Excl. in [place]? --     Constitutional: Alert and oriented. Well appearing and in no acute distress. Eyes: Conjunctivae are normal. PERRL. EOMI. Head: Atraumatic. Nose: Edematous nasal turbinates clear rhinorrhea. Mouth/Throat: Mucous membranes are moist.  Oropharynx non-erythematous. Neck: No stridor.  Hematological/Lymphatic/Immunilogical: No cervical lymphadenopathy. Cardiovascular: Normal rate, regular rhythm. Grossly normal  heart sounds.  Good peripheral circulation.  Elevated blood pressure. Respiratory: Normal respiratory effort.  No retractions. Lungs CTAB. Gastrointestinal: Soft and nontender. No distention. No abdominal bruits. No CVA tenderness. Musculoskeletal: No lower extremity tenderness nor edema.  No joint effusions. Neurologic:  Normal speech and language. No gross focal neurologic deficits are appreciated. No gait instability. Skin:  Skin is warm, dry and intact. No rash noted. Psychiatric: Mood and affect are normal. Speech and behavior are normal.  ____________________________________________   LABS (all labs ordered are listed, but only abnormal results are displayed)  Labs Reviewed  GROUP A STREP BY PCR  SARS CORONAVIRUS 2 (TAT 6-24 HRS)   ____________________________________________  EKG   ____________________________________________  RADIOLOGY  ED MD interpretation:    Official radiology report(s): DG Chest Portable 1 View  Result Date: 07/04/2019 CLINICAL DATA:  Cough and chest congestion. Sore throat and headache. EXAM: PORTABLE CHEST 1  VIEW COMPARISON:  Chest x-ray dated 01/16/2016 FINDINGS: The heart size and mediastinal contours are within normal limits. Both lungs are clear. The visualized skeletal structures are unremarkable. IMPRESSION: Normal exam. Electronically Signed   By: Lorriane Shire M.D.   On: 07/04/2019 14:34    ____________________________________________   PROCEDURES  Procedure(s) performed (including Critical Care):  Procedures   ____________________________________________   INITIAL IMPRESSION / ASSESSMENT AND PLAN / ED COURSE  As part of my medical decision making, I reviewed the following data within the [facility]     Patient presents with sore throat, runny nose, and nonproductive cough for few days.  Discussed negative chest x-ray and rapid strep results with patient.  Patient is exam consistent with viral respiratory  infection.  Patient advised to self quarantine pending results of COVID-19 test.  Take medication as directed.    ANNALYCE BUCIO was evaluated in Emergency Department on 07/04/2019 for the symptoms described in the history of present illness. She was evaluated in the context of the global COVID-19 pandemic, which necessitated consideration that the patient might be at risk for infection with the SARS-CoV-2 virus that causes COVID-19. Institutional protocols and algorithms that pertain to the evaluation of patients at risk for COVID-19 are in a state of rapid change based on information released by regulatory bodies including the CDC and federal and state organizations. These policies and algorithms were followed during the patient's care in the ED.       ____________________________________________   FINAL CLINICAL IMPRESSION(S) / ED DIAGNOSES  Final diagnoses:  Viral URI with cough  Viral sore throat     ED Discharge Orders         Ordered    brompheniramine-pseudoephedrine-DM 30-2-10 MG/5ML syrup  4 times daily PRN     07/04/19 1504           Note:  This document was prepared using Dragon voice recognition software and may include unintentional dictation errors.    Sable Feil, PA-C 07/04/19 1507    Blake Divine, MD 07/04/19 [phone]

## 2019-07-05 LAB — SARS CORONAVIRUS 2 (TAT 6-24 HRS): SARS Coronavirus 2: POSITIVE — AB

## 2019-07-06 ENCOUNTER — Encounter: Payer: Self-pay | Admitting: Infectious Diseases

## 2019-07-07 ENCOUNTER — Telehealth: Payer: Self-pay | Admitting: Infectious Diseases

## 2019-07-07 NOTE — Telephone Encounter (Signed)
Called to discuss with patient about Covid symptoms and the use of bamlanivimab, a monoclonal antibody infusion for those with mild to moderate Covid symptoms and at a high risk of hospitalization.  Pt is qualified for this infusion at the [facility] infusion center due to Age >55 and Hypertension.    Started with symptoms on 12/24 with head cold symptoms. She would like to think about it and call back.

## 2019-07-08 ENCOUNTER — Other Ambulatory Visit: Payer: Self-pay | Admitting: Infectious Diseases

## 2019-07-08 DIAGNOSIS — U071 COVID-19: Secondary | ICD-10-CM

## 2019-07-08 NOTE — Progress Notes (Signed)
  I connected by phone with Kathryn Whitehead on 07/08/2019 at 2:11 PM to discuss the potential use of an new treatment for mild to moderate COVID-19 viral infection in non-hospitalized patients.  This patient is a 63 y.o. female that meets the FDA criteria for Emergency Use Authorization of bamlanivimab or casirivimab\imdevimab.  Has a (+) direct SARS-CoV-2 viral test result  Has mild or moderate COVID-19   Is ? 63 years of age and weighs ? 40 kg  Is NOT hospitalized due to COVID-19  Is NOT requiring oxygen therapy or requiring an increase in baseline oxygen flow rate due to COVID-19  Is within 10 days of symptom onset  Has at least one of the high risk factor(s) for progression to severe COVID-19 and/or hospitalization as defined in EUA.  Specific high risk criteria : Hypertension   I have spoken and communicated the following to the patient or parent/caregiver:  1. FDA has authorized the emergency use of bamlanivimab and casirivimab\imdevimab for the treatment of mild to moderate COVID-19 in adults and pediatric patients with positive results of direct SARS-CoV-2 viral testing who are 53 years of age and older weighing at least 40 kg, and who are at high risk for progressing to severe COVID-19 and/or hospitalization.  2. The significant known and potential risks and benefits of bamlanivimab and casirivimab\imdevimab, and the extent to which such potential risks and benefits are unknown.  3. Information on available alternative treatments and the risks and benefits of those alternatives, including clinical trials.  4. Patients treated with bamlanivimab and casirivimab\imdevimab should continue to self-isolate and use infection control measures (e.g., wear mask, isolate, social distance, avoid sharing personal items, clean and disinfect "high touch" surfaces, and frequent handwashing) according to CDC guidelines.   5. The patient or parent/caregiver has the option to accept or refuse  bamlanivimab or casirivimab\imdevimab .  After reviewing this information with the patient, The patient agreed to proceed with receiving the bamlanimivab infusion and will be provided a copy of the Fact sheet prior to receiving the infusion.[name] 07/08/2019 2:11 PM

## 2019-07-10 ENCOUNTER — Ambulatory Visit (HOSPITAL_COMMUNITY)
Admission: RE | Admit: 2019-07-10 | Discharge: 2019-07-10 | Disposition: A | Discharge status: Home / Self Care | Payer: 59 | Source: Ambulatory Visit | Attending: Pulmonary Disease | Admitting: Pulmonary Disease

## 2019-07-10 DIAGNOSIS — U071 COVID-19: Secondary | ICD-10-CM | POA: Diagnosis not present

## 2019-07-10 MED ORDER — SODIUM CHLORIDE 0.9 % IV SOLN
700.0000 mg | Freq: Once | INTRAVENOUS | Status: AC
  Administered 2019-07-10: 700 mg via INTRAVENOUS
  Filled 2019-07-10: qty 20

## 2019-07-10 MED ORDER — EPINEPHRINE 0.3 MG/0.3ML IJ SOAJ: 0.3000 mg | Freq: Once | INTRAMUSCULAR | Status: DC | PRN

## 2019-07-10 MED ORDER — DIPHENHYDRAMINE HCL 50 MG/ML IJ SOLN: 50.0000 mg | Freq: Once | INTRAMUSCULAR | Status: DC | PRN

## 2019-07-10 MED ORDER — FAMOTIDINE IN NACL 20-0.9 MG/50ML-% IV SOLN: 20.0000 mg | Freq: Once | INTRAVENOUS | Status: DC | PRN

## 2019-07-10 MED ORDER — SODIUM CHLORIDE 0.9 % IV SOLN
INTRAVENOUS | Status: DC | PRN
  Administered 2019-07-10: 250 mL via INTRAVENOUS

## 2019-07-10 MED ORDER — METHYLPREDNISOLONE SODIUM SUCC 125 MG IJ SOLR: 125.0000 mg | Freq: Once | INTRAMUSCULAR | Status: DC | PRN

## 2019-07-10 MED ORDER — ALBUTEROL SULFATE HFA 108 (90 BASE) MCG/ACT IN AERS: 2.0000 | INHALATION_SPRAY | Freq: Once | RESPIRATORY_TRACT | Status: DC | PRN

## 2019-07-10 NOTE — Progress Notes (Signed)
  Diagnosis: COVID-19  Physician: Dr. Carrie Mew  Procedure: [facility] Infusion [facility] Med: bamlanivimab infusion - Provided patient with bamlanimivab fact sheet for patients, parents and caregivers prior to infusion.  Complications: No immediate complications noted.  Discharge: Discharged home   Kathryn Whitehead N Mung Rinker 07/10/2019

## 2019-07-10 NOTE — Discharge Instructions (Addendum)

## 2019-11-17 ENCOUNTER — Other Ambulatory Visit: Payer: Self-pay | Admitting: Physician Assistant

## 2019-11-17 DIAGNOSIS — M545 Low back pain, unspecified: Secondary | ICD-10-CM

## 2019-11-17 DIAGNOSIS — G8929 Other chronic pain: Secondary | ICD-10-CM

## 2019-11-28 ENCOUNTER — Ambulatory Visit
Admission: RE | Admit: 2019-11-28 | Discharge: 2019-11-28 | Disposition: A | Discharge status: Home / Self Care | Payer: 59 | Source: Ambulatory Visit | Attending: Physician Assistant | Admitting: Physician Assistant

## 2019-11-28 ENCOUNTER — Other Ambulatory Visit: Payer: Self-pay

## 2019-11-28 DIAGNOSIS — M545 Low back pain, unspecified: Secondary | ICD-10-CM

## 2019-11-28 DIAGNOSIS — G8929 Other chronic pain: Secondary | ICD-10-CM | POA: Diagnosis present

## 2020-03-19 ENCOUNTER — Other Ambulatory Visit: Payer: Self-pay | Admitting: Physician Assistant

## 2020-03-19 DIAGNOSIS — Z1231 Encounter for screening mammogram for malignant neoplasm of breast: Secondary | ICD-10-CM

## 2020-04-08 ENCOUNTER — Ambulatory Visit
Admission: RE | Admit: 2020-04-08 | Discharge: 2020-04-08 | Disposition: A | Discharge status: Home / Self Care | Payer: 59 | Source: Home / Self Care | Attending: Neurosurgery | Admitting: Neurosurgery

## 2020-04-08 ENCOUNTER — Ambulatory Visit
Admission: RE | Admit: 2020-04-08 | Discharge: 2020-04-08 | Disposition: A | Discharge status: Home / Self Care | Payer: 59 | Source: Ambulatory Visit | Attending: Neurosurgery | Admitting: Neurosurgery

## 2020-04-08 ENCOUNTER — Ambulatory Visit
Admission: RE | Admit: 2020-04-08 | Discharge: 2020-04-08 | Disposition: A | Discharge status: Home / Self Care | Payer: 59 | Source: Ambulatory Visit | Attending: Physician Assistant | Admitting: Physician Assistant

## 2020-04-08 ENCOUNTER — Other Ambulatory Visit: Payer: Self-pay

## 2020-04-08 ENCOUNTER — Other Ambulatory Visit: Payer: Self-pay | Admitting: Neurosurgery

## 2020-04-08 DIAGNOSIS — M419 Scoliosis, unspecified: Secondary | ICD-10-CM | POA: Insufficient documentation

## 2020-04-08 DIAGNOSIS — Z1231 Encounter for screening mammogram for malignant neoplasm of breast: Secondary | ICD-10-CM | POA: Insufficient documentation

## 2021-04-29 ENCOUNTER — Other Ambulatory Visit: Payer: Self-pay

## 2021-04-29 ENCOUNTER — Ambulatory Visit
Admission: RE | Admit: 2021-04-29 | Discharge: 2021-04-29 | Disposition: A | Discharge status: Home / Self Care | Payer: Medicare Other | Source: Ambulatory Visit | Attending: Gastroenterology | Admitting: Gastroenterology

## 2021-04-29 ENCOUNTER — Ambulatory Visit: Payer: Medicare Other | Admitting: Certified Registered"

## 2021-04-29 ENCOUNTER — Encounter
Admission: RE | Disposition: A | Discharge status: Home / Self Care | Payer: Self-pay | Source: Ambulatory Visit | Attending: Gastroenterology

## 2021-04-29 DIAGNOSIS — D120 Benign neoplasm of cecum: Secondary | ICD-10-CM | POA: Insufficient documentation

## 2021-04-29 DIAGNOSIS — Z1211 Encounter for screening for malignant neoplasm of colon: Secondary | ICD-10-CM | POA: Diagnosis present

## 2021-04-29 DIAGNOSIS — D123 Benign neoplasm of transverse colon: Secondary | ICD-10-CM | POA: Insufficient documentation

## 2021-04-29 HISTORY — DX: Other specified disorders of bone density and structure, unspecified site: M85.80

## 2021-04-29 HISTORY — PX: COLONOSCOPY WITH PROPOFOL: SHX5780

## 2021-04-29 HISTORY — DX: Other intervertebral disc degeneration, lumbar region: M51.36

## 2021-04-29 HISTORY — DX: Bradycardia, unspecified: R00.1

## 2021-04-29 HISTORY — DX: Late syphilis, latent: A52.8

## 2021-04-29 HISTORY — DX: Moderate persistent asthma, uncomplicated: J45.40

## 2021-04-29 HISTORY — DX: Other intervertebral disc degeneration, lumbar region without mention of lumbar back pain or lower extremity pain: M51.369

## 2021-04-29 SURGERY — COLONOSCOPY WITH PROPOFOL
Anesthesia: General

## 2021-04-29 MED ORDER — PROPOFOL 500 MG/50ML IV EMUL
INTRAVENOUS | Status: AC
  Filled 2021-04-29: qty 50

## 2021-04-29 MED ORDER — GLYCOPYRROLATE 0.2 MG/ML IJ SOLN
INTRAMUSCULAR | Status: DC | PRN
  Administered 2021-04-29: .2 mg via INTRAVENOUS

## 2021-04-29 MED ORDER — SODIUM CHLORIDE 0.9 % IV SOLN: INTRAVENOUS | Status: DC

## 2021-04-29 MED ORDER — PROPOFOL 10 MG/ML IV BOLUS
INTRAVENOUS | Status: AC
  Filled 2021-04-29: qty 40

## 2021-04-29 MED ORDER — PROPOFOL 500 MG/50ML IV EMUL
INTRAVENOUS | Status: DC | PRN
  Administered 2021-04-29: 120 ug/kg/min via INTRAVENOUS

## 2021-04-29 MED ORDER — PROPOFOL 10 MG/ML IV BOLUS
INTRAVENOUS | Status: DC | PRN
  Administered 2021-04-29: 60 mg via INTRAVENOUS

## 2021-04-29 NOTE — Op Note (Signed)
[facility] Patient Name: Kathryn Whitehead Procedure Date: 04/29/2021 8:20 AM MRN: [id number] Account #: [id number] Date of Birth: 03-17-1956 Admit Type: Outpatient Age: 65 Room: [facility] ENDO ROOM 1 Gender: Female Note Status: Finalized Instrument Name: Colonoscope [id number] Procedure:             Colonoscopy Indications:           High risk colon cancer surveillance: Personal history                         of colonic polyps Providers:             Annamaria Helling DO, DO Medicines:             Monitored Anesthesia Care Complications:         No immediate complications. Estimated blood loss:                         Minimal. Procedure:             Pre-Anesthesia Assessment:                        - Prior to the procedure, a History and Physical was                         performed, and patient medications and allergies were                         reviewed. The patient is competent. The risks and                         benefits of the procedure and the sedation options and                         risks were discussed with the patient. All questions                         were answered and informed consent was obtained.                         Patient identification and proposed procedure were                         verified by the physician, the nurse, the anesthetist                         and the technician in the endoscopy suite. Mental                         Status Examination: alert and oriented. Airway                         Examination: normal oropharyngeal airway and neck                         mobility. Respiratory Examination: clear to                         auscultation. CV Examination: RRR, no murmurs, no S3  or S4. Prophylactic Antibiotics: The patient does not                         require prophylactic antibiotics. Prior                         Anticoagulants: The patient has taken no previous                          anticoagulant or antiplatelet agents. ASA Grade                         Assessment: II - A patient with mild systemic disease.                         After reviewing the risks and benefits, the patient                         was deemed in satisfactory condition to undergo the                         procedure. The anesthesia plan was to use monitored                         anesthesia care (MAC). Immediately prior to                         administration of medications, the patient was                         re-assessed for adequacy to receive sedatives. The                         heart rate, respiratory rate, oxygen saturations,                         blood pressure, adequacy of pulmonary ventilation, and                         response to care were monitored throughout the                         procedure. The physical status of the patient was                         re-assessed after the procedure.                        After obtaining informed consent, the colonoscope was                         passed under direct vision. Throughout the procedure,                         the patient's blood pressure, pulse, and oxygen                         saturations were monitored continuously. The  Colonoscope was introduced through the anus and                         advanced to the the terminal ileum, with                         identification of the appendiceal orifice and IC                         valve. The colonoscopy was performed without                         difficulty. The patient tolerated the procedure well.                         The quality of the bowel preparation was evaluated                         using the BBPS [facility] Bowel Preparation Scale) with                         scores of: Right Colon = 2 (minor amount of residual                         staining, small fragments of stool and/or opaque                          liquid, but mucosa seen well), Transverse Colon = 3                         (entire mucosa seen well with no residual staining,                         small fragments of stool or opaque liquid) and Left                         Colon = 3 (entire mucosa seen well with no residual                         staining, small fragments of stool or opaque liquid).                         The total BBPS score equals 8. The quality of the                         bowel preparation was excellent. The terminal ileum,                         ileocecal valve, appendiceal orifice, and rectum were                         photographed. Findings:      The perianal and digital rectal examinations were normal. Pertinent       negatives include normal sphincter tone.      The terminal ileum appeared normal. Estimated blood loss: none.      Two sessile polyps were found in the cecum. The polyps  were 2 to 3 mm in       size. Estimated blood loss was minimal. These polyps were removed with a       cold biopsy forceps. Resection and retrieval were complete.      Two sessile polyps were found in the cecum. The polyps were 6 to 7 mm in       size. These polyps were removed with a cold snare. Resection and       retrieval were complete. Estimated blood loss was minimal.      Two sessile polyps were found in the transverse colon. The polyps were 2       to 3 mm in size. These polyps were removed with a cold biopsy forceps.       Resection and retrieval were complete. Estimated blood loss was minimal.      A 5 to 6 mm polyp was found in the descending colon. The polyp was       sessile. The polyp was removed with a cold biopsy forceps. Resection and       retrieval were complete. Estimated blood loss was minimal.      The exam was otherwise without abnormality on direct and retroflexion       views. Impression:            - The examined portion of the ileum was normal.                        - Two 2 to 3 mm polyps in  the cecum, removed with a                         cold biopsy forceps. Resected and retrieved.                        - Two 6 to 7 mm polyps in the cecum, removed with a                         cold snare. Resected and retrieved.                        - Two 2 to 3 mm polyps in the transverse colon,                         removed with a cold biopsy forceps. Resected and                         retrieved.                        - One 5 to 6 mm polyp in the descending colon, removed                         with a cold biopsy forceps. Resected and retrieved.                        - The examination was otherwise normal on direct and                         retroflexion views. Recommendation:        - Discharge patient to home.                        -  Resume previous diet.                        - No aspirin, ibuprofen, naproxen, or other                         non-steroidal anti-inflammatory drugs for 5 days after                         polyp removal.                        - Continue present medications.                        - Await pathology results.                        - Repeat colonoscopy for surveillance after piecemeal                         polypectomy.                        - Return to referring physician as previously                         scheduled. Procedure Code(s):     --- Professional ---                        [id number], Colonoscopy, flexible; with removal of                         tumor(s), polyp(s), or other lesion(s) by snare                         technique                        45380, 41, Colonoscopy, flexible; with biopsy, single                         or multiple Diagnosis Code(s):     --- Professional ---                        K63.5, Polyp of colon                        Z86.010, Personal history of colonic polyps CPT copyright 2019 [employer]. All rights reserved. The codes documented in this report are preliminary and upon coder  review may  be revised to meet current compliance requirements. Attending Participation:      I personally performed the entire procedure. Volney American, DO Annamaria Helling DO, DO 04/29/2021 9:15:51 AM This report has been signed electronically. Number of Addenda: 0 Note Initiated On: 04/29/2021 8:20 AM Scope Withdrawal Time: 0 hours 29 minutes 48 seconds  Total Procedure Duration: 0 hours 35 minutes 30 seconds  Estimated Blood Loss:  Estimated blood loss was minimal.      [facility]

## 2021-04-29 NOTE — H&P (Signed)
[facility] Pre-Procedure H&P   Patient ID: Kathryn Whitehead is a 65 y.o. female.  Gastroenterology Provider: Annamaria Helling, DO  Referring Provider: Paulita Cradle  Date: 04/29/2021  HPI Kathryn Whitehead is a 65 y.o. female who presents today for Colonoscopy for personal history of colon polyps.No family h/o crc or polyps  Hgb 13.3, mcv 90.  Underwent Colonoscopy in 07/2012 with Dr. Mariana Arn at [facility]- noted to have one TA 6-7 mm and 1 TVA 40mm.  No melena, hematochezia, diarrhea, constipation, abdominal pain, weight loss.   Past Medical History:  Diagnosis Date   Degenerative disc disease, lumbar    Difficult intubation    GERD (gastroesophageal reflux disease)    Hypercholesterolemia    Hypertension    Hypothyroidism    Late latent syphilis    Osteopenia    Reactive airway disease, moderate persistent, uncomplicated    Sinus bradycardia    Thyroid disease     Past Surgical History:  Procedure Laterality Date   ANTERIOR CERVICAL DECOMP/DISCECTOMY FUSION N/A 04/23/2019   Procedure: ANTERIOR CERVICAL DECOMPRESSION/DISCECTOMY FUSION 1 LEVEL C4-5;  Surgeon: Meade Maw, MD;  Location: [facility] ORS;  Service: Neurosurgery;  Laterality: N/A;   BACK SURGERY  1994   herniated disc removed lumbar area ([place])   [place]     teenager   TUBAL LIGATION      Family History No h/o GI disease or malignancy  Review of Systems  Constitutional:  Negative for activity change, appetite change, fatigue, fever and unexpected weight change.  HENT:  Negative for trouble swallowing and voice change.   Respiratory:  Negative for shortness of breath and wheezing.   Cardiovascular:  Negative for chest pain and palpitations.  Gastrointestinal:  Negative for abdominal distention, abdominal pain, anal bleeding, blood in stool, constipation, diarrhea, nausea, rectal pain  and vomiting.  Musculoskeletal:  Negative for arthralgias and myalgias.  Skin:  Negative for color change and pallor.  Neurological:  Negative for dizziness, syncope and weakness.  Psychiatric/Behavioral:  Negative for confusion.   All other systems reviewed and are negative.   Medications No current facility-administered medications on file prior to encounter.   Current Outpatient Medications on File Prior to Encounter  Medication Sig Dispense Refill   calcium carbonate (OSCAL) 1500 (600 Ca) MG TABS tablet Take 600 mg of elemental calcium by mouth daily with breakfast.     cholecalciferol (VITAMIN D3) 25 MCG (1000 UT) tablet Take 1,000 Units by mouth daily.     fluticasone (FLONASE) 50 MCG/ACT nasal spray Place into both nostrils daily.     hydrALAZINE (APRESOLINE) 50 MG tablet Take 50 mg by mouth 2 (two) times daily.     hydrochlorothiazide (HYDRODIURIL) 25 MG tablet Take 25 mg by mouth daily.     levothyroxine (SYNTHROID) 75 MCG tablet Take 75 mcg by mouth daily before breakfast.     levothyroxine (SYNTHROID) 88 MCG tablet Take 88 mcg by mouth daily before breakfast.     meloxicam (MOBIC) 15 MG tablet Take 15 mg by mouth daily.     pantoprazole (PROTONIX) 40 MG tablet Take 40 mg by mouth daily.     Potassium 99 MG TABS Take by mouth.     senna (SENOKOT) 8.6 MG tablet Take 1 tablet by mouth daily.     simvastatin (ZOCOR) 10 MG tablet Take 10 mg by mouth at bedtime.  triamcinolone cream (KENALOG) 0.1 % Apply 1 application topically 2 (two) times daily.     acetaminophen (TYLENOL) 325 MG tablet Take 2 tablets (650 mg total) by mouth every 4 (four) hours as needed for mild pain ((score 1 to 3) or temp > 100.5).     brompheniramine-pseudoephedrine-DM 30-2-10 MG/5ML syrup Take 5 mLs by mouth 4 (four) times daily as needed. 120 mL 0   celecoxib (CELEBREX) 200 MG capsule Take 1 capsule (200 mg total) by mouth every 12 (twelve) hours. (Patient not taking: Reported on 04/29/2021) 30 capsule 0    diphenhydramine-acetaminophen (TYLENOL PM) 25-500 MG TABS tablet Take 1-2 tablets by mouth at bedtime as needed (pain/sleep).     hydroxypropyl methylcellulose / hypromellose (ISOPTO TEARS / GONIOVISC) 2.5 % ophthalmic solution Place 1 drop into both eyes 3 (three) times daily as needed for dry eyes.     methocarbamol (ROBAXIN) 500 MG tablet Take 1 tablet (500 mg total) by mouth every 6 (six) hours as needed for muscle spasms. (Patient not taking: Reported on 04/29/2021) 120 tablet 0   Nutritional Supplements (ESTROVEN PO) Take 1 tablet by mouth daily.     oxyCODONE (OXY IR/ROXICODONE) 5 MG immediate release tablet Take 1 tablet (5 mg total) by mouth every 3 (three) hours as needed for moderate pain ((score 4 to 6)). 30 tablet 0   PARoxetine Mesylate 7.5 MG CAPS Take 7.5 mg by mouth daily. (Patient not taking: Reported on 04/29/2021)      Pertinent medications related to GI and procedure were reviewed by me with the patient prior to the procedure   Current Facility-Administered Medications:    0.9 %  sodium chloride infusion, , Intravenous, Continuous, Annamaria Helling, DO  sodium chloride         Allergies  Allergen Reactions   Amlodipine Itching   Ampicillin Rash   Montelukast Itching   Allergies were reviewed by me prior to the procedure  Objective    Vitals:   04/29/21 0754  BP: (!) 155/92  Pulse: 62  Resp: 16  Temp: (!) 97 F (36.1 C)  TempSrc: Temporal  SpO2: 98%  Weight: 64.4 kg  Height: 5' (1.524 m)     Physical Exam Vitals reviewed.  Constitutional:      General: She is not in acute distress.    Appearance: Normal appearance. She is not ill-appearing, toxic-appearing or diaphoretic.  HENT:     Head: Normocephalic and atraumatic.     Nose: Nose normal.     Mouth/Throat:     Mouth: Mucous membranes are moist.     Pharynx: Oropharynx is clear.  Eyes:     General: No scleral icterus.    Extraocular Movements: Extraocular movements intact.   Cardiovascular:     Rate and Rhythm: Normal rate and regular rhythm.     Heart sounds: Normal heart sounds. No murmur heard.   No friction rub. No gallop.  Pulmonary:     Effort: Pulmonary effort is normal. No respiratory distress.     Breath sounds: Normal breath sounds. No wheezing, rhonchi or rales.  Abdominal:     General: Bowel sounds are normal. There is no distension.     Palpations: Abdomen is soft.     Tenderness: There is no abdominal tenderness. There is no guarding or rebound.  Musculoskeletal:     Cervical back: Neck supple.     Right lower leg: No edema.     Left lower leg: No edema.  Skin:  General: Skin is warm and dry.     Coloration: Skin is not jaundiced or pale.  Neurological:     General: No focal deficit present.     Mental Status: She is alert and oriented to person, place, and time. Mental status is at baseline.  Psychiatric:        Mood and Affect: Mood normal.        Behavior: Behavior normal.        Thought Content: Thought content normal.        Judgment: Judgment normal.     Assessment:  Ms. TRESEA HEINE is a 65 y.o. female  who presents today for Colonoscopy for personal history of colon polyps.  Plan:  Colonoscopy with possible intervention today  Colonoscopy with possible biopsy, control of bleeding, polypectomy, and interventions as necessary has been discussed with the patient/patient representative. Informed consent was obtained from the patient/patient representative after explaining the indication, nature, and risks of the procedure including but not limited to death, bleeding, perforation, missed neoplasm/lesions, cardiorespiratory compromise, and reaction to medications. Opportunity for questions was given and appropriate answers were provided. Patient/patient representative has verbalized understanding is amenable to undergoing the procedure.   Annamaria Helling, DO  [facility]  Portions of the record may have  been created with voice recognition software. Occasional wrong-word or 'sound-a-like' substitutions may have occurred due to the inherent limitations of voice recognition software.  Read the chart carefully and recognize, using context, where substitutions may have occurred.

## 2021-04-29 NOTE — Anesthesia Preprocedure Evaluation (Signed)
Anesthesia Evaluation  Patient identified by MRN, date of birth, ID band Patient awake    Reviewed: Allergy & Precautions, NPO status , Patient's Chart, lab work & pertinent test results  Airway Mallampati: III  TM Distance: >3 FB Neck ROM: full    Dental  (+) Missing, Caps, Chipped   Pulmonary neg pulmonary ROS,    Pulmonary exam normal breath sounds clear to auscultation       Cardiovascular Exercise Tolerance: Good hypertension, Pt. on medications negative cardio ROS Normal cardiovascular exam Rhythm:Regular Rate:Normal     Neuro/Psych negative neurological ROS  negative psych ROS   GI/Hepatic negative GI ROS, Neg liver ROS, GERD  Medicated,  Endo/Other  negative endocrine ROSHypothyroidism   Renal/GU negative Renal ROS  negative genitourinary   Musculoskeletal   Abdominal Normal abdominal exam  (+)   Peds negative pediatric ROS (+)  Hematology negative hematology ROS (+)   Anesthesia Other Findings Past Medical History: No date: Degenerative disc disease, lumbar No date: Difficult intubation No date: GERD (gastroesophageal reflux disease) No date: Hypercholesterolemia No date: Hypertension No date: Hypothyroidism No date: Late latent syphilis No date: Osteopenia No date: Reactive airway disease, moderate persistent, uncomplicated No date: Sinus bradycardia No date: Thyroid disease  Past Surgical History: 04/23/2019: ANTERIOR CERVICAL DECOMP/DISCECTOMY FUSION; N/A     Comment:  Procedure: ANTERIOR CERVICAL DECOMPRESSION/DISCECTOMY               FUSION 1 LEVEL C4-5;  Surgeon: Meade Maw, MD;                Location: [facility] ORS;  Service: Neurosurgery;  Laterality:               N/A; 1994: BACK SURGERY     Comment:  herniated disc removed lumbar area [facility]) 1983: CESAREAN SECTION No date: COLONOSCOPY No date: TONSILLECTOMY No date: TONSILLECTOMY AND ADENOIDECTOMY     Comment:   teenager No date: TUBAL LIGATION  BMI    Body Mass Index: 27.73 kg/m      Reproductive/Obstetrics negative OB ROS                             Anesthesia Physical Anesthesia Plan  ASA: 2  Anesthesia Plan: General   Post-op Pain Management:    Induction: Intravenous  PONV Risk Score and Plan: 1 and Propofol infusion and TIVA  Airway Management Planned: Nasal Cannula  Additional Equipment:   Intra-op Plan:   Post-operative Plan:   Informed Consent: I have reviewed the patients History and Physical, chart, labs and discussed the procedure including the risks, benefits and alternatives for the proposed anesthesia with the patient or authorized representative who has indicated his/her understanding and acceptance.     Dental Advisory Given  Plan Discussed with: CRNA and Surgeon  Anesthesia Plan Comments:         Anesthesia Quick Evaluation

## 2021-04-29 NOTE — Transfer of Care (Signed)
Immediate Anesthesia Transfer of Care Note  Patient: Kathryn Whitehead  Procedure(s) Performed: COLONOSCOPY WITH PROPOFOL  Patient Location: PACU and Endoscopy Unit  Anesthesia Type:General  Level of Consciousness: drowsy  Airway & Oxygen Therapy: Patient Spontanous Breathing  Post-op Assessment: Report given to RN  Post vital signs: stable  Last Vitals:  Vitals Value Taken Time  BP    Temp    Pulse    Resp    SpO2      Last Pain:  Vitals:   04/29/21 0754  TempSrc: Temporal  PainSc: 2          Complications: No notable events documented.

## 2021-04-29 NOTE — Anesthesia Postprocedure Evaluation (Signed)
Anesthesia Post Note  Patient: Kathryn Whitehead  Procedure(s) Performed: COLONOSCOPY WITH PROPOFOL  Patient location during evaluation: PACU Anesthesia Type: General Level of consciousness: awake, awake and alert, oriented and patient cooperative Pain management: pain level controlled Vital Signs Assessment: post-procedure vital signs reviewed and stable Respiratory status: spontaneous breathing, nonlabored ventilation and respiratory function stable Cardiovascular status: stable Postop Assessment: no apparent nausea or vomiting Anesthetic complications: no   No notable events documented.   Last Vitals:  Vitals:   04/29/21 0920 04/29/21 0930  BP: 139/78 (!) 156/93  Pulse: 62 62  Resp: 18   Temp:    SpO2: 99% 99%    Last Pain:  Vitals:   04/29/21 0905  TempSrc:   PainSc: 0-No pain                 VAN STAVEREN,Aava Deland

## 2021-04-29 NOTE — Interval H&P Note (Signed)
History and Physical Interval Note: Preprocedure H&P from 04/29/21  was reviewed and there was no interval change after seeing and examining the patient.  Written consent was obtained from the patient after discussion of risks, benefits, and alternatives. Patient has consented to proceed with Colonoscopy with possible intervention   04/29/2021 8:17 AM  Kathryn Whitehead  has presented today for surgery, with the diagnosis of personal history colon polyps.  The various methods of treatment have been discussed with the patient and family. After consideration of risks, benefits and other options for treatment, the patient has consented to  Procedure(s): COLONOSCOPY WITH PROPOFOL (N/A) as a surgical intervention.  The patient's history has been reviewed, patient examined, no change in status, stable for surgery.  I have reviewed the patient's chart and labs.  Questions were answered to the patient's satisfaction.     Annamaria Helling

## 2021-05-01 ENCOUNTER — Encounter: Payer: Self-pay | Admitting: Gastroenterology

## 2021-05-02 LAB — SURGICAL PATHOLOGY

## 2021-05-13 ENCOUNTER — Other Ambulatory Visit: Payer: Self-pay | Admitting: Physician Assistant

## 2021-05-13 DIAGNOSIS — Z1231 Encounter for screening mammogram for malignant neoplasm of breast: Secondary | ICD-10-CM

## 2021-09-02 ENCOUNTER — Other Ambulatory Visit: Payer: Self-pay

## 2021-09-02 ENCOUNTER — Ambulatory Visit
Admission: RE | Admit: 2021-09-02 | Discharge: 2021-09-02 | Disposition: A | Discharge status: Home / Self Care | Payer: Medicare Other | Source: Ambulatory Visit | Attending: Physician Assistant | Admitting: Physician Assistant

## 2021-09-02 DIAGNOSIS — Z1231 Encounter for screening mammogram for malignant neoplasm of breast: Secondary | ICD-10-CM | POA: Insufficient documentation

## 2021-10-18 ENCOUNTER — Encounter: Payer: Self-pay | Admitting: Physical Therapy

## 2021-10-18 ENCOUNTER — Ambulatory Visit: Payer: Medicare Other | Attending: Orthopaedic Surgery | Admitting: Physical Therapy

## 2021-10-18 DIAGNOSIS — M5459 Other low back pain: Secondary | ICD-10-CM | POA: Diagnosis present

## 2021-10-18 DIAGNOSIS — R262 Difficulty in walking, not elsewhere classified: Secondary | ICD-10-CM | POA: Insufficient documentation

## 2021-10-18 DIAGNOSIS — M25551 Pain in right hip: Secondary | ICD-10-CM | POA: Diagnosis present

## 2021-10-18 DIAGNOSIS — M5416 Radiculopathy, lumbar region: Secondary | ICD-10-CM | POA: Insufficient documentation

## 2021-10-18 NOTE — Therapy (Signed)
?OUTPATIENT PHYSICAL THERAPY THORACOLUMBAR EVALUATION ? ? ?Patient Name: Kathryn Whitehead ?MRN: [id number] ?DOB:1955/12/18, 66 y.o., female ?Today's Date: 10/18/2021 ? ? PT End of Session - 10/18/21 1931   ? ? Visit Number 1   ? Number of Visits 24   ? Date for PT Re-Evaluation 01/10/22   ? Authorization Type MEDICARE PART B reporting period from 10/18/2021   ? Progress Note Due on Visit 10   ? PT Start Time 1800   ? PT Stop Time 1900   ? PT Time Calculation (min) 60 min   ? Activity Tolerance Patient tolerated treatment well   ? Behavior During Therapy [facility] for tasks assessed/performed   ? ?  ?  ? ?  ? ? ?Past Medical History:  ?Diagnosis Date  ? Degenerative disc disease, lumbar   ? Difficult intubation   ? GERD (gastroesophageal reflux disease)   ? Hypercholesterolemia   ? Hypertension   ? Hypothyroidism   ? Late latent syphilis   ? Osteopenia   ? Reactive airway disease, moderate persistent, uncomplicated   ? Sinus bradycardia   ? Thyroid disease   ? ?Past Surgical History:  ?Procedure Laterality Date  ? ANTERIOR CERVICAL DECOMP/DISCECTOMY FUSION N/A 04/23/2019  ? Procedure: ANTERIOR CERVICAL DECOMPRESSION/DISCECTOMY FUSION 1 LEVEL C4-5;  Surgeon: Meade Maw, MD;  Location: [facility] ORS;  Service: Neurosurgery;  Laterality: N/A;  ? BACK SURGERY  1994  ? herniated disc removed lumbar area [facility])  ? [place]  ? COLONOSCOPY    ? COLONOSCOPY WITH PROPOFOL N/A 04/29/2021  ? Procedure: COLONOSCOPY WITH PROPOFOL;  Surgeon: Annamaria Helling, DO;  Location: [facility] ENDOSCOPY;  Service: Gastroenterology;  Laterality: N/A;  ? TONSILLECTOMY    ? TONSILLECTOMY AND ADENOIDECTOMY    ? teenager  ? TUBAL LIGATION    ? ?Patient Active Problem List  ? Diagnosis Date Noted  ? Cervical myelopathy ([place]) 04/23/2019  ? Osteopenia of multiple sites 10/23/2018  ? Late latent syphilis 01/08/2018  ? Sinus bradycardia by electrocardiogram 11/07/2017  ? RAD (reactive airway disease), moderate persistent, uncomplicated  75/04/2584  ? Chronic midline low back pain without sciatica 09/05/2016  ? Hot flashes, menopausal 09/05/2016  ? Mid back pain on left side 09/05/2016  ? Postmenopausal 03/30/2016  ? Gastroesophageal reflux disease without esophagitis 12/24/2014  ? Degenerative disc disease, lumbar 12/24/2014  ? Acquired hypothyroidism 09/12/2014  ? Benign essential hypertension 09/12/2014  ? Pure hypercholesterolemia 09/12/2014  ? ? ?PCP: Marinda Elk, MD ? ?REFERRING PROVIDER: Starling Manns, MD ? ?REFERRING DIAG: secondary scoliosis, lumbar region  ? ?THERAPY DIAG:  ?Other low back pain ? ?Radiculopathy, lumbar region ? ?Pain in right hip ? ?Difficulty in walking, not elsewhere classified ? ?ONSET DATE: chronic from about 1994, but several months ago got worse ? ?SUBJECTIVE:                                                                                                                                                                                          ? ?  SUBJECTIVE STATEMENT: ?Patient reports her back pain started years ago when she lived in [place] and she fell at the roller skating rink and her back started hurting in 1995 and she had back surgery at that point. She had a herniated disc that was irritating a nerve so she could not stand on her R LE. This improved the problem. A few years later in [place] a doctor said she has scoliosis. To her one thing kind of led to another. Scoliosis seems to be getting worse and her doctor at [facility] sent her to PT and she came to this clinic due to having later hours. She states in the last few months her back pain has been getting worse. She is having really bad pain across her lower back and she has numbness in her right knee that is also getting worse. She states the symptoms are happening more often, are more intense, and the area seems bigger. She has also been having some trouble with her hands swelling and hurting. She states it is particularly  bad today and she noticed it started 4-5 days ago. This is in the dorsum in both hands and the left wrist. She did have neck surgery a couple of years ago (chart review ACDF 04/23/2019 fusion C4-5, Meade Maw, MD). She had numbness/tingling in both hands that resolved with the surgery. She states she has had pian down her legs where the scoliosis was getting worse and it caused numbness and tingling in the right leg to the R knee and now it is going into her left calf (medial/posterior). Right shoulder has started getting really painful at night when she is trying to sleep. This started about a week ago. She has been moving to a new house and lifting and moving boxes and furniture more lately. She is right handed.  ? ? ?PERTINENT HISTORY:  ?Patient is a 66 y.o. female who presents to outpatient physical therapy with a referral for medical diagnosis other secondary scoliosis, lumbar region. This patient's chief complaints consist of low back pain with radiation to right LE knee and medial calf leading to the following functional deficits: difficulty with prolonged positions, walking, sitting, working, hobbies, lifting, moving to a new house, going up and down stairs, etc. ?Relevant past medical history and comorbidities include reactive airway disease, HTN, GERD, hx of cervical myelopathy with gait disruption (ACDF 04/22/2022 at C4-5), osteopenia, pure hypercholesterolemia, hypothyroidism, unexplained dropping things and stumbling.  Patient denies hx of cancer, stroke, seizures, heart problems, diabetes, unexplained weight loss, and unexplained changes in bowel or bladder problems. She has had some unexplained dropping things and stumbling.  ? ?PAIN:  ?Are you having pain? Yes: NPRS scale: Current: 0/10,  Best: 0/10, Worst: 9/10. ?Pain location:  ? Current location: across lower back, switches side that is worst.  ? Since start of this episode: across lower back, paresthesia over right knee and medial lower  leg, achiness in the right groin. Occasional glute pain on right. Denies left sided LE pain.  ?Pain description: achy, paresthesia,  ?Aggravating factors: standing, walking a lot, going sit to stand after sitting, stairs, bending, lifting, housework, sitting too much.  ?Relieving factors: laying on her back or on either side, sitting, meloxicam, tylenol. Occasionally walking after too much sitting.  ? ?FUNCTIONAL LIMITATIONS: difficulty with prolonged positions, walking, sitting, working, hobbies, lifting, moving to a new house, going up and down stairs, etc.  ? ?PRECAUTIONS: None ? ?WEIGHT BEARING RESTRICTIONS No ? ?FALLS:  ?Has  patient fallen in last 6 months? No ? ?LIVING ENVIRONMENT: ?Lives with: lives with their boyfreind and is in process of moving in with boyfreind's parent's house ?Lives in: House/apartment ?Stairs: No, ramp to enter, stairs to the basement with a chair lift.  ?Has following equipment at home: None ? ?OCCUPATION: works full time for [employer], desk job ? ?LEISURE: makes greeting cards on computer, TV shows ? ?PLOF: Independent ? ?PATIENT GOALS: "to try to reduce some of the pain" ? ? ?OBJECTIVE:  ? ?DIAGNOSTIC FINDINGS:  ?Scoliosis radiograph report from 04/08/2020:  ?"FINDINGS: ?Frontal and lateral views of the spine. Dominant levoscoliosis of ?the thoracolumbar spine, this measures 33 degrees, apex left, from ?superior endplate of X83 to inferior endplate of L5. There is ?rotatory component. 12 mm left lateral listhesis L3 on L4. ?  ?Sagittal image demonstrate straightening of the cervical spine with ?post fusion changes at C4-C5. There is mild thoracic kyphosis and ?mild lumbar lordosis. Degenerative osteophytes at multiple levels of ?the thoracic spine. Advanced degenerative changes at multiple levels ?of the lumbar spine. ?  ?IMPRESSION: ?1. Dominant levoscoliosis of the thoracolumbar spine measures 33 ?degrees with rotatory component as above. ?2. Degenerative changes of the thoracic and  lumbar spine." ? ?Lumbar MRI report 11/29/2019:  ?"FINDINGS: ?Segmentation:  Standard. ?  ?Alignment: Lumbar levocurvature. Grade 1 stepwise L1-2 and L2-3 ?retrolisthesis. Grade 1 L5-S1 retrolisthesis. ?

## 2021-10-20 ENCOUNTER — Encounter: Payer: Self-pay | Admitting: Physical Therapy

## 2021-10-20 ENCOUNTER — Ambulatory Visit: Payer: Medicare Other | Admitting: Physical Therapy

## 2021-10-20 DIAGNOSIS — M5459 Other low back pain: Secondary | ICD-10-CM | POA: Diagnosis not present

## 2021-10-20 DIAGNOSIS — R262 Difficulty in walking, not elsewhere classified: Secondary | ICD-10-CM

## 2021-10-20 DIAGNOSIS — M5416 Radiculopathy, lumbar region: Secondary | ICD-10-CM

## 2021-10-20 DIAGNOSIS — M25551 Pain in right hip: Secondary | ICD-10-CM

## 2021-10-20 NOTE — Therapy (Signed)
?OUTPATIENT PHYSICAL THERAPY TREATMENT NOTE ? ? ?Patient Name: Kathryn Whitehead ?MRN: [id number] ?DOB:07-Aug-1955, 66 y.o., female ?Today's Date: 10/20/2021 ? ? PT End of Session - 10/20/21 1752   ? ? Visit Number 2   ? Number of Visits 24   ? Date for PT Re-Evaluation 01/10/22   ? Authorization Type MEDICARE PART B reporting period from 10/18/2021   ? Progress Note Due on Visit 10   ? PT Start Time 7416   ? PT Stop Time 1728   ? PT Time Calculation (min) 33 min   ? Activity Tolerance Patient tolerated treatment well   ? Behavior During Therapy [facility] for tasks assessed/performed   ? ?  ?  ? ?  ? ? ? ?Past Medical History:  ?Diagnosis Date  ? Degenerative disc disease, lumbar   ? Difficult intubation   ? GERD (gastroesophageal reflux disease)   ? Hypercholesterolemia   ? Hypertension   ? Hypothyroidism   ? Late latent syphilis   ? Osteopenia   ? Reactive airway disease, moderate persistent, uncomplicated   ? Sinus bradycardia   ? Thyroid disease   ? ?Past Surgical History:  ?Procedure Laterality Date  ? ANTERIOR CERVICAL DECOMP/DISCECTOMY FUSION N/A 04/23/2019  ? Procedure: ANTERIOR CERVICAL DECOMPRESSION/DISCECTOMY FUSION 1 LEVEL C4-5;  Surgeon: Meade Maw, MD;  Location: [facility] ORS;  Service: Neurosurgery;  Laterality: N/A;  ? BACK SURGERY  1994  ? herniated disc removed lumbar area [facility])  ? [place]  ? COLONOSCOPY    ? COLONOSCOPY WITH PROPOFOL N/A 04/29/2021  ? Procedure: COLONOSCOPY WITH PROPOFOL;  Surgeon: Annamaria Helling, DO;  Location: [facility] ENDOSCOPY;  Service: Gastroenterology;  Laterality: N/A;  ? TONSILLECTOMY    ? TONSILLECTOMY AND ADENOIDECTOMY    ? teenager  ? TUBAL LIGATION    ? ?Patient Active Problem List  ? Diagnosis Date Noted  ? Cervical myelopathy ([place]) 04/23/2019  ? Osteopenia of multiple sites 10/23/2018  ? Late latent syphilis 01/08/2018  ? Sinus bradycardia by electrocardiogram 11/07/2017  ? RAD (reactive airway disease), moderate persistent, uncomplicated 38/45/3646   ? Chronic midline low back pain without sciatica 09/05/2016  ? Hot flashes, menopausal 09/05/2016  ? Mid back pain on left side 09/05/2016  ? Postmenopausal 03/30/2016  ? Gastroesophageal reflux disease without esophagitis 12/24/2014  ? Degenerative disc disease, lumbar 12/24/2014  ? Acquired hypothyroidism 09/12/2014  ? Benign essential hypertension 09/12/2014  ? Pure hypercholesterolemia 09/12/2014  ? ? ?PCP: Marinda Elk, MD ? ?REFERRING PROVIDER: Starling Manns, MD ? ?REFERRING DIAG: secondary scoliosis, lumbar region  ? ?THERAPY DIAG:  ?Other low back pain ? ?Radiculopathy, lumbar region ? ?Pain in right hip ? ?Difficulty in walking, not elsewhere classified ? ?ONSET DATE: chronic from about 1994, but several months ago got worse ? ?PERTINENT HISTORY: Patient is a 66 y.o. female who presents to outpatient physical therapy with a referral for medical diagnosis other secondary scoliosis, lumbar region. This patient's chief complaints consist of low back pain with radiation to right LE knee and medial calf leading to the following functional deficits: difficulty with prolonged positions, walking, sitting, working, hobbies, lifting, moving to a new house, going up and down stairs, etc. Relevant past medical history and comorbidities include reactive airway disease, HTN, GERD, hx of cervical myelopathy with gait disruption (ACDF 04/22/2022 at C4-5), osteopenia, pure hypercholesterolemia, hypothyroidism, unexplained dropping things and stumbling.  Patient denies hx of cancer, stroke, seizures, heart problems, diabetes, unexplained weight loss, and unexplained changes in bowel  or bladder problems. She has had some unexplained dropping things and stumbling.  ? ?PRECAUTIONS: none ? ?PATIENT GOALS: "to try to reduce some of the pain" ? ?SUBJECTIVE: Patient reports she is feeling well. She had difficulty getting to the clinic on time after having difficulty with traffic on [address]. She states she was a little  sore after her PT eval but not too bad. Her hand swelling continued to bother her so she sought care for it yesterday and they asked her to stop the Meloxicam and put her on prednisone. The hand swelling is better and her back is feeling better. She has a mild headache.  ? ?PAIN:  ?Are you having pain? Yes: NPRS scale: Current: 1/10 headache over forehead. ?                                                                                                                                                                                      ? ? ?TODAY'S TREATMENT  ?Therapeutic exercise: to centralize symptoms and improve ROM, strength, muscular endurance, and activity tolerance required for successful completion of functional activities.  ?NuStep level 1 using bilateral upper and lower extremities. Seat/handle setting 6/8. For improved extremity mobility, muscular endurance, and activity tolerance; and to induce the analgesic effect of aerobic exercise, stimulate improved joint nutrition, and prepare body structures and systems for following interventions. x 6  minutes. Average SPM = 57. ?Hooklying TrA contraction with alternating LE extension, 3x10 each side ?Prone with pillow under hips: TrA contraction with alternating hip extension with knees extended, 3x10 each side.  ?- Education on HEP including handout  ? ?Pt required multimodal cuing for proper technique and to facilitate improved neuromuscular control, strength, range of motion, and functional ability resulting in improved performance and form. ? ? ?PATIENT EDUCATION:  ?Education details: Exercise purpose/form. Self management techniques. Education on diagnosis, prognosis, POC, anatomy and physiology of current condition Education on HEP including handout  ?Person educated: Patient ?Education method: Explanation and Demonstration ?Education comprehension: verbalized understanding and needs further education ? ? ?HOME EXERCISE PROGRAM: ?Access Code:  [email or web address] ?URL: [email or web address] ?Date: 10/20/2021 ?Prepared by: Rosita Kea ? ?Exercises ?- Supine Transversus Abdominis Bracing with Leg Extension  - 3 x weekly - 3 sets - 20 reps ?- Prone Hip Extension with Pillow Under Abdomen  - 3 x weekly - 3 sets - 20 reps ? ?ASSESSMENT: ? ?CLINICAL IMPRESSION: ?Patient tolerated treatment well overall and was able to complete all exercises on HEP without increased pain. Plan to continue updating HEP and using progressive core and functional strengthening as appropriate next session. Patient would benefit from continued management of limiting condition by skilled  physical therapist to address remaining impairments and functional limitations to work towards stated goals and return to PLOF or maximal functional independence.  ? ?Patient is a 66 y.o. female referred to outpatient physical therapy with a medical diagnosis of other secondary scoliosis, lumbar region who presents with signs and symptoms consistent with acute on chronic bilateral low back pain with intermittent radiation to right LE below the knee in the context of levoscoliosis mainly at the lumbar region. Patient presents with significant pain, ROM, posture, joint stiffness, muscle tension, motor control, muscle performance (strength/power/endurance) and activity tolerance impairments that are limiting ability to complete her usual activities such as prolonged positions, walking, sitting, working, hobbies, lifting, moving to a new house, going up and down stairs, etc  without difficulty and decreases her quality of life. Patient will benefit from skilled physical therapy intervention to address current body structure impairments and activity limitations to improve function and work towards goals set in current POC in order to return to prior level of function or maximal functional improvement.  ? ?OBJECTIVE IMPAIRMENTS decreased activity tolerance, decreased balance, decreased endurance,  decreased mobility, difficulty walking, decreased ROM, decreased strength, hypomobility, impaired perceived functional ability, increased muscle spasms, impaired flexibility, impaired sensation, postural dysfunction, an

## 2021-10-24 ENCOUNTER — Ambulatory Visit: Payer: Medicare Other | Admitting: Physical Therapy

## 2021-10-24 ENCOUNTER — Telehealth: Payer: Self-pay | Admitting: Physical Therapy

## 2021-10-24 ENCOUNTER — Encounter: Payer: Self-pay | Admitting: Physical Therapy

## 2021-10-24 DIAGNOSIS — M5459 Other low back pain: Secondary | ICD-10-CM

## 2021-10-24 DIAGNOSIS — R262 Difficulty in walking, not elsewhere classified: Secondary | ICD-10-CM

## 2021-10-24 DIAGNOSIS — M5416 Radiculopathy, lumbar region: Secondary | ICD-10-CM

## 2021-10-24 DIAGNOSIS — M25551 Pain in right hip: Secondary | ICD-10-CM

## 2021-10-24 NOTE — Therapy (Signed)
?OUTPATIENT PHYSICAL THERAPY TREATMENT NOTE ? ? ?Patient Name: Kathryn Whitehead ?MRN: [id number] ?DOB:[birth year], 66 y.o., female ?Today's Date: 10/24/2021 ? ? PT End of Session - 10/24/21 1745   ? ? Visit Number 3   ? Number of Visits 24   ? Date for PT Re-Evaluation 01/10/22   ? Authorization Type MEDICARE PART B reporting period from 10/18/2021   ? Progress Note Due on Visit 10   ? PT Start Time 1711   ? PT Stop Time 4098   ? PT Time Calculation (min) 40 min   ? Activity Tolerance Patient tolerated treatment well   ? Behavior During Therapy [facility] for tasks assessed/performed   ? ?  ?  ? ?  ? ? ? ? ?Past Medical History:  ?Diagnosis Date  ? Degenerative disc disease, lumbar   ? Difficult intubation   ? GERD (gastroesophageal reflux disease)   ? Hypercholesterolemia   ? Hypertension   ? Hypothyroidism   ? Late latent syphilis   ? Osteopenia   ? Reactive airway disease, moderate persistent, uncomplicated   ? Sinus bradycardia   ? Thyroid disease   ? ?Past Surgical History:  ?Procedure Laterality Date  ? ANTERIOR CERVICAL DECOMP/DISCECTOMY FUSION N/A 04/23/2019  ? Procedure: ANTERIOR CERVICAL DECOMPRESSION/DISCECTOMY FUSION 1 LEVEL C4-5;  Surgeon: Meade Maw, MD;  Location: [facility] ORS;  Service: Neurosurgery;  Laterality: N/A;  ? BACK SURGERY  1994  ? herniated disc removed lumbar area [facility])  ? [place]  ? COLONOSCOPY    ? COLONOSCOPY WITH PROPOFOL N/A 04/29/2021  ? Procedure: COLONOSCOPY WITH PROPOFOL;  Surgeon: Annamaria Helling, DO;  Location: [facility] ENDOSCOPY;  Service: Gastroenterology;  Laterality: N/A;  ? TONSILLECTOMY    ? TONSILLECTOMY AND ADENOIDECTOMY    ? teenager  ? TUBAL LIGATION    ? ?Patient Active Problem List  ? Diagnosis Date Noted  ? Cervical myelopathy ([place]) 04/23/2019  ? Osteopenia of multiple sites 10/23/2018  ? Late latent syphilis 01/08/2018  ? Sinus bradycardia by electrocardiogram 11/07/2017  ? RAD (reactive airway disease), moderate persistent, uncomplicated  11/91/4782  ? Chronic midline low back pain without sciatica 09/05/2016  ? Hot flashes, menopausal 09/05/2016  ? Mid back pain on left side 09/05/2016  ? Postmenopausal 03/30/2016  ? Gastroesophageal reflux disease without esophagitis 12/24/2014  ? Degenerative disc disease, lumbar 12/24/2014  ? Acquired hypothyroidism 09/12/2014  ? Benign essential hypertension 09/12/2014  ? Pure hypercholesterolemia 09/12/2014  ? ? ?PCP: Marinda Elk, MD ? ?REFERRING PROVIDER: Starling Manns, MD ? ?REFERRING DIAG: secondary scoliosis, lumbar region  ? ?THERAPY DIAG:  ?Other low back pain ? ?Radiculopathy, lumbar region ? ?Pain in right hip ? ?Difficulty in walking, not elsewhere classified ? ?ONSET DATE: chronic from about 1994, but several months ago got worse ? ?PERTINENT HISTORY: Patient is a 66 y.o. female who presents to outpatient physical therapy with a referral for medical diagnosis other secondary scoliosis, lumbar region. This patient's chief complaints consist of low back pain with radiation to right LE knee and medial calf leading to the following functional deficits: difficulty with prolonged positions, walking, sitting, working, hobbies, lifting, moving to a new house, going up and down stairs, etc. Relevant past medical history and comorbidities include reactive airway disease, HTN, GERD, hx of cervical myelopathy with gait disruption (ACDF 04/22/2022 at C4-5), osteopenia, pure hypercholesterolemia, hypothyroidism, unexplained dropping things and stumbling, latex sensitivity.  Patient denies hx of cancer, stroke, seizures, heart problems, diabetes, unexplained weight loss, and unexplained  changes in bowel or bladder problems. She has had some unexplained dropping things. She is planniing to have major surgery in her low back (possibly at mid thoracic to sacral fusion?) in November 2023.  ? ?PRECAUTIONS: none ? ?PATIENT GOALS: "to try to reduce some of the pain" ? ?SUBJECTIVE: Patient reports she is feeling  well today. Her back pain is low and today is the last day of her prednisone. She states her hand swelling is gone. She felt okay after last PT session and her HEP is going well. She is almost finished moving but she doesn't have have the treadmill moved yet. She went for a walk today at work but it had to be really short (4-5 min) because her back was hurting too much. She had a follow up with [facility]ist to discus other things needed to prepare for her low back surgery. She states they are going to do a bone density scan and a CT or MRI to get ready.  They are going to do a low back "reconstruction' from the mid back to the low back putting in "rods and screws" in two stages. The first surgery will be shorter 2-3 hours and the second is 4-6 hours. It will be done in [facility]. Several weeks in between. They are roughly thinking the surgery will be in November after she retires unless things get worse and she has to do it sooner.  ? ?PAIN:  ?Are you having pain? Yes: NPRS scale: Current: up to 3/10 when moving today in right low back ?                                                                                                                                                                                      ? ?TODAY'S TREATMENT  ?Therapeutic exercise: to centralize symptoms and improve ROM, strength, muscular endurance, and activity tolerance required for successful completion of functional activities.  ?NuStep level 3 using bilateral upper and lower extremities. Seat/handle setting 6/8. For improved extremity mobility, muscular endurance, and activity tolerance; and to induce the analgesic effect of aerobic exercise, stimulate improved joint nutrition, and prepare body structures and systems for following interventions. x 7:25  minutes. Average SPM = 91. ?Standing posterior pelvic tilt against wall, 1x20 with 5 second hold (further reps to get technique right).  ?Step standing pallof  press while holding posterior pelvic tilt (PPT), 2x10 each side with each foot on 8.5 inch step.  ?Standing hip abduction at hip machine, 2x10 each side at 40#. Mild PPT. ?Hooklying TrA contraction with alternating LE extension, 2x10 each side (needed mild cuing to remember how to perform. ?Prone  with pillow under hips: TrA contraction with alternating hip extension with knees extended, 2x10 each side.  ? ?Pt required multimodal cuing for proper technique and to facilitate improved neuromuscular control, strength, range of motion, and functional ability resulting in improved performance and form. ? ? ?PATIENT EDUCATION:  ?Education details: Exercise purpose/form. Self management techniques. Education on diagnosis, prognosis, POC, anatomy and physiology of current condition Education on HEP including handout  ?Person educated: Patient ?Education method: Explanation and Demonstration ?Education comprehension: verbalized understanding and needs further education ? ? ?HOME EXERCISE PROGRAM: ?Access Code: [email or web address] ?URL: [email or web address] ?Date: 10/20/2021 ?Prepared by: Rosita Kea ? ?Exercises ?- Supine Transversus Abdominis Bracing with Leg Extension  - 3 x weekly - 3 sets - 20 reps ?- Prone Hip Extension with Pillow Under Abdomen  - 3 x weekly - 3 sets - 20 reps ? ?ASSESSMENT: ? ?CLINICAL IMPRESSION: ?Patient tolerated treatment well overall with no increase in pain by end of session. Utilized posterior pelvic tilt to improve standing tolerance. Session focused on core and hip strengthening to improve lumbopelvic stability and activity tolerance. Plan to continue with similar interventions progressed as appropriate next session. Patient would benefit from continued management of limiting condition by skilled physical therapist to address remaining impairments and functional limitations to work towards stated goals and return to PLOF or maximal functional independence.  ? ?Patient is a 66 y.o. female  referred to outpatient physical therapy with a medical diagnosis of other secondary scoliosis, lumbar region who presents with signs and symptoms consistent with acute on chronic bilateral low back pain with intermitt

## 2021-10-27 ENCOUNTER — Ambulatory Visit: Payer: Medicare Other | Admitting: Physical Therapy

## 2021-10-27 ENCOUNTER — Encounter: Payer: Self-pay | Admitting: Physical Therapy

## 2021-10-27 DIAGNOSIS — M5459 Other low back pain: Secondary | ICD-10-CM

## 2021-10-27 DIAGNOSIS — R262 Difficulty in walking, not elsewhere classified: Secondary | ICD-10-CM

## 2021-10-27 DIAGNOSIS — M5416 Radiculopathy, lumbar region: Secondary | ICD-10-CM

## 2021-10-27 DIAGNOSIS — M25551 Pain in right hip: Secondary | ICD-10-CM

## 2021-10-27 NOTE — Therapy (Addendum)
?OUTPATIENT PHYSICAL THERAPY TREATMENT NOTE ? ? ?Patient Name: Kathryn Whitehead ?MRN: [id number] ?DOB:1955-12-29, 66 y.o., female ?Today's Date: 10/27/2021 ? ? PT End of Session - 10/27/21 1734   ? ? Visit Number 4   ? Number of Visits 24   ? Date for PT Re-Evaluation 01/10/22   ? Authorization Type MEDICARE PART B reporting period from 10/18/2021   ? Progress Note Due on Visit 10   ? PT Start Time 4268   ? PT Stop Time 1730   ? PT Time Calculation (min) 40 min   ? Activity Tolerance Patient tolerated treatment well   ? Behavior During Therapy [facility] for tasks assessed/performed   ? ?  ?  ? ?  ? ? ? ? ? ?Past Medical History:  ?Diagnosis Date  ? Degenerative disc disease, lumbar   ? Difficult intubation   ? GERD (gastroesophageal reflux disease)   ? Hypercholesterolemia   ? Hypertension   ? Hypothyroidism   ? Late latent syphilis   ? Osteopenia   ? Reactive airway disease, moderate persistent, uncomplicated   ? Sinus bradycardia   ? Thyroid disease   ? ?Past Surgical History:  ?Procedure Laterality Date  ? ANTERIOR CERVICAL DECOMP/DISCECTOMY FUSION N/A 04/23/2019  ? Procedure: ANTERIOR CERVICAL DECOMPRESSION/DISCECTOMY FUSION 1 LEVEL C4-5;  Surgeon: Meade Maw, MD;  Location: [facility] ORS;  Service: Neurosurgery;  Laterality: N/A;  ? BACK SURGERY  1994  ? herniated disc removed lumbar area [facility])  ? [place]  ? COLONOSCOPY    ? COLONOSCOPY WITH PROPOFOL N/A 04/29/2021  ? Procedure: COLONOSCOPY WITH PROPOFOL;  Surgeon: Annamaria Helling, DO;  Location: [facility] ENDOSCOPY;  Service: Gastroenterology;  Laterality: N/A;  ? TONSILLECTOMY    ? TONSILLECTOMY AND ADENOIDECTOMY    ? teenager  ? TUBAL LIGATION    ? ?Patient Active Problem List  ? Diagnosis Date Noted  ? Cervical myelopathy ([place]) 04/23/2019  ? Osteopenia of multiple sites 10/23/2018  ? Late latent syphilis 01/08/2018  ? Sinus bradycardia by electrocardiogram 11/07/2017  ? RAD (reactive airway disease), moderate persistent, uncomplicated  34/19/6222  ? Chronic midline low back pain without sciatica 09/05/2016  ? Hot flashes, menopausal 09/05/2016  ? Mid back pain on left side 09/05/2016  ? Postmenopausal 03/30/2016  ? Gastroesophageal reflux disease without esophagitis 12/24/2014  ? Degenerative disc disease, lumbar 12/24/2014  ? Acquired hypothyroidism 09/12/2014  ? Benign essential hypertension 09/12/2014  ? Pure hypercholesterolemia 09/12/2014  ? ? ?PCP: Marinda Elk, MD ? ?REFERRING PROVIDER: Starling Manns, MD ? ?REFERRING DIAG: secondary scoliosis, lumbar region  ? ?THERAPY DIAG:  ?Other low back pain ? ?Radiculopathy, lumbar region ? ?Pain in right hip ? ?Difficulty in walking, not elsewhere classified ? ?ONSET DATE: chronic from about 1994, but several months ago got worse ? ?PERTINENT HISTORY: Patient is a 66 y.o. female who presents to outpatient physical therapy with a referral for medical diagnosis other secondary scoliosis, lumbar region. This patient's chief complaints consist of low back pain with radiation to right LE knee and medial calf leading to the following functional deficits: difficulty with prolonged positions, walking, sitting, working, hobbies, lifting, moving to a new house, going up and down stairs, etc. Relevant past medical history and comorbidities include reactive airway disease, HTN, GERD, hx of cervical myelopathy with gait disruption (ACDF 04/22/2022 at C4-5), osteopenia, pure hypercholesterolemia, hypothyroidism, unexplained dropping things and stumbling, latex sensitivity.  Patient denies hx of cancer, stroke, seizures, heart problems, diabetes, unexplained weight loss, and  unexplained changes in bowel or bladder problems. She has had some unexplained dropping things. She is planniing to have major surgery in her low back (possibly at mid thoracic to sacral fusion?) in November 2023.  ? ?PRECAUTIONS: none ? ?PATIENT GOALS: "to try to reduce some of the pain" ? ?SUBJECTIVE:  Patient states her left low back  is bothering her today. She thinks she may have overdone it unpacking last night. She states she took Meloxicam and Tylenol this morning and it helped.  ? ?PAIN:  ?Are you having pain? Yes: NPRS scale: Current: up to 2/10 when moving today in left low back ?                                                                                                                                                                                     ?TODAY'S TREATMENT  ?Therapeutic exercise: to centralize symptoms and improve ROM, strength, muscular endurance, and activity tolerance required for successful completion of functional activities.  ?NuStep level 4 using bilateral upper and lower extremities. Seat/handle setting 6/8. For improved extremity mobility, muscular endurance, and activity tolerance; and to induce the analgesic effect of aerobic exercise, stimulate improved joint nutrition, and prepare body structures and systems for following interventions. x 5  minutes. Average SPM = 80 ?Standing posterior pelvic tilt against wall, 1x20 with 1 second hold (further reps to get technique right).  ?Step standing pallof press while holding posterior pelvic tilt (PPT), 3x10 each side with each foot on small dynadisc. YTB loop. ?Standing hip abduction at hip machine, 3x10 each side at 40#. Mild PPT. ?Seated lat pull, 1x5 at 25#, 2x10 with 15# ? ?Pt required multimodal cuing for proper technique and to facilitate improved neuromuscular control, strength, range of motion, and functional ability resulting in improved performance and form. ? ? ?PATIENT EDUCATION:  ?Education details: Exercise purpose/form. Self management techniques. Education on diagnosis, prognosis, POC, anatomy and physiology of current condition  ?Person educated: Patient ?Education method: Explanation and Demonstration ?Education comprehension: verbalized understanding and needs further education ? ? ?HOME EXERCISE PROGRAM: ?Access Code: [email or web address] ?URL:  [email or web address] ?Date: 10/20/2021 ?Prepared by: Rosita Kea ? ?Exercises ?- Supine Transversus Abdominis Bracing with Leg Extension  - 3 x weekly - 3 sets - 20 reps ?- Prone Hip Extension with Pillow Under Abdomen  - 3 x weekly - 3 sets - 20 reps ? ?ASSESSMENT: ? ?CLINICAL IMPRESSION: ?Patient tolerated treatment well overall with no increase in pain by end of session. Patient was able to progress in her exercises and had brief discomfort with part of the pallof press exercise that resolved with rest. Plan to continue core and LE strengthening progressed  as appropriate next session. Patient would benefit from continued management of limiting condition by skilled physical therapist to address remaining impairments and functional limitations to work towards stated goals and return to PLOF or maximal functional independence.  ? ?Patient is a 66 y.o. female referred to outpatient physical therapy with a medical diagnosis of other secondary scoliosis, lumbar region who presents with signs and symptoms consistent with acute on chronic bilateral low back pain with intermittent radiation to right LE below the knee in the context of levoscoliosis mainly at the lumbar region. Her referring provider has recommended major surgery later in the year for her low back.  Patient presents with significant pain, ROM, posture, joint stiffness, muscle tension, motor control, muscle performance (strength/power/endurance) and activity tolerance impairments that are limiting ability to complete her usual activities such as prolonged positions, walking, sitting, working, hobbies, lifting, moving to a new house, going up and down stairs, etc  without difficulty and decreases her quality of life. Patient will benefit from skilled physical therapy intervention to address current body structure impairments and activity limitations to improve function and work towards goals set in current POC in order to return to prior level  of function or maximal functional improvement.  ? ?OBJECTIVE IMPAIRMENTS decreased activity tolerance, decreased balance, decreased endurance, decreased mobility, difficulty walking, decreased ROM, decreased strength,

## 2021-10-31 ENCOUNTER — Encounter: Payer: Self-pay | Admitting: Physical Therapy

## 2021-10-31 ENCOUNTER — Ambulatory Visit: Payer: Medicare Other | Admitting: Physical Therapy

## 2021-10-31 DIAGNOSIS — R262 Difficulty in walking, not elsewhere classified: Secondary | ICD-10-CM

## 2021-10-31 DIAGNOSIS — M5459 Other low back pain: Secondary | ICD-10-CM | POA: Diagnosis not present

## 2021-10-31 DIAGNOSIS — M25551 Pain in right hip: Secondary | ICD-10-CM

## 2021-10-31 DIAGNOSIS — M5416 Radiculopathy, lumbar region: Secondary | ICD-10-CM

## 2021-10-31 NOTE — Therapy (Signed)
?OUTPATIENT PHYSICAL THERAPY TREATMENT NOTE ? ? ?Patient Name: Kathryn Whitehead ?MRN: [id number] ?DOB:[birth year], 66 y.o., female ?Today's Date: 10/31/2021 ? ? PT End of Session - 10/31/21 1716   ? ? Visit Number 5   ? Number of Visits 24   ? Date for PT Re-Evaluation 01/10/22   ? Authorization Type MEDICARE PART B reporting period from 10/18/2021   ? Progress Note Due on Visit 10   ? PT Start Time 5102   ? PT Stop Time 5852   ? PT Time Calculation (min) 40 min   ? Activity Tolerance Patient tolerated treatment well   ? Behavior During Therapy [facility] for tasks assessed/performed   ? ?  ?  ? ?  ? ? ? ? ? ? ?Past Medical History:  ?Diagnosis Date  ? Degenerative disc disease, lumbar   ? Difficult intubation   ? GERD (gastroesophageal reflux disease)   ? Hypercholesterolemia   ? Hypertension   ? Hypothyroidism   ? Late latent syphilis   ? Osteopenia   ? Reactive airway disease, moderate persistent, uncomplicated   ? Sinus bradycardia   ? Thyroid disease   ? ?Past Surgical History:  ?Procedure Laterality Date  ? ANTERIOR CERVICAL DECOMP/DISCECTOMY FUSION N/A 04/23/2019  ? Procedure: ANTERIOR CERVICAL DECOMPRESSION/DISCECTOMY FUSION 1 LEVEL C4-5;  Surgeon: Meade Maw, MD;  Location: [facility] ORS;  Service: Neurosurgery;  Laterality: N/A;  ? BACK SURGERY  1994  ? herniated disc removed lumbar area [facility])  ? [place]  ? COLONOSCOPY    ? COLONOSCOPY WITH PROPOFOL N/A 04/29/2021  ? Procedure: COLONOSCOPY WITH PROPOFOL;  Surgeon: Annamaria Helling, DO;  Location: [facility] ENDOSCOPY;  Service: Gastroenterology;  Laterality: N/A;  ? TONSILLECTOMY    ? TONSILLECTOMY AND ADENOIDECTOMY    ? teenager  ? TUBAL LIGATION    ? ?Patient Active Problem List  ? Diagnosis Date Noted  ? Cervical myelopathy ([place]) 04/23/2019  ? Osteopenia of multiple sites 10/23/2018  ? Late latent syphilis 01/08/2018  ? Sinus bradycardia by electrocardiogram 11/07/2017  ? RAD (reactive airway disease), moderate persistent, uncomplicated  77/82/4235  ? Chronic midline low back pain without sciatica 09/05/2016  ? Hot flashes, menopausal 09/05/2016  ? Mid back pain on left side 09/05/2016  ? Postmenopausal 03/30/2016  ? Gastroesophageal reflux disease without esophagitis 12/24/2014  ? Degenerative disc disease, lumbar 12/24/2014  ? Acquired hypothyroidism 09/12/2014  ? Benign essential hypertension 09/12/2014  ? Pure hypercholesterolemia 09/12/2014  ? ? ?PCP: Marinda Elk, MD ? ?REFERRING PROVIDER: Starling Manns, MD ? ?REFERRING DIAG: secondary scoliosis, lumbar region  ? ?THERAPY DIAG:  ?Other low back pain ? ?Radiculopathy, lumbar region ? ?Pain in right hip ? ?Difficulty in walking, not elsewhere classified ? ?ONSET DATE: chronic from about 1994, but several months ago got worse ? ?PERTINENT HISTORY: Patient is a 66 y.o. female who presents to outpatient physical therapy with a referral for medical diagnosis other secondary scoliosis, lumbar region. This patient's chief complaints consist of low back pain with radiation to right LE knee and medial calf leading to the following functional deficits: difficulty with prolonged positions, walking, sitting, working, hobbies, lifting, moving to a new house, going up and down stairs, etc. Relevant past medical history and comorbidities include reactive airway disease, HTN, GERD, hx of cervical myelopathy with gait disruption (ACDF 04/22/2022 at C4-5), osteopenia, pure hypercholesterolemia, hypothyroidism, unexplained dropping things and stumbling, latex sensitivity.  Patient denies hx of cancer, stroke, seizures, heart problems, diabetes, unexplained weight loss,  and unexplained changes in bowel or bladder problems. She has had some unexplained dropping things. She is planniing to have major surgery in her low back (possibly at mid thoracic to sacral fusion?) in November 2023.  ? ?PRECAUTIONS: none ? ?PATIENT GOALS: "to try to reduce some of the pain" ? ?SUBJECTIVE:  Patient states she is doing well  and has only had a few twinges of pain today, mostly while walking at work. She states she felt fine after last PT session. Her HEP is going well. She states she did a little unpacking this weekend but also got some rest.  ? ?PAIN:  ?Are you having pain? Yes: NPRS scale: currently 0/10, earlier when walking up to 3/10 twinges of pain in mid low back and left low back ?   ?OBJECTIVE ? ?SELF-REPORTED FUNCTION ?FOTO score: 53/100 (lumbar spine questionnaire) ?                                                                                                                                                                                   ?TODAY'S TREATMENT  ?Therapeutic exercise: to centralize symptoms and improve ROM, strength, muscular endurance, and activity tolerance required for successful completion of functional activities.  ?NuStep level 5 using bilateral upper and lower extremities. Seat/handle setting 6/8. For improved extremity mobility, muscular endurance, and activity tolerance; and to induce the analgesic effect of aerobic exercise, stimulate improved joint nutrition, and prepare body structures and systems for following interventions. x 5  minutes. Average SPM = 73. (Feeling effort in legs).  ?Hooklying posterior pelvic tilt (PPT) with alternating LE extension, 2x10 each side. Cuing for improved PPT. ?Standing posterior pelvic tilt against wall with marching, 2x10 each side. ?Step standing pallof press mini-rotation while holding posterior pelvic tilt (PPT), 3x10 each side with each foot on small dynadisc. YTB loop. CGA.  ? ?Superset: ?Seated lat pull, 3x10 with 20# ?Seated row progressing to without chest supported at [place], 3x10 at 10# ? ?Standing hip abduction at hip machine, 3x10 each side at 40#. Mild PPT. ?Squats with buttock touch on 18 inch chair with no UE support, 2x10 (legs feel heavy, cuing for improved form) ? ?Pt required multimodal cuing for proper technique and to facilitate improved  neuromuscular control, strength, range of motion, and functional ability resulting in improved performance and form. ? ? ?PATIENT EDUCATION:  ?Education details: Exercise purpose/form. Self management techniques. Education on diagnosis, prognosis, POC, anatomy and physiology of current condition  ?Person educated: Patient ?Education method: Explanation and Demonstration ?Education comprehension: verbalized understanding and needs further education ? ? ?HOME EXERCISE PROGRAM: ?Access Code: [email or web address] ?URL: [email or web address] ?Date: 10/20/2021 ?Prepared by: Rosita Kea ? ?Exercises ?-  Supine Transversus Abdominis Bracing with Leg Extension  - 3 x weekly - 3 sets - 20 reps ?- Prone Hip Extension with Pillow Under Abdomen  - 3 x weekly - 3 sets - 20 reps ? ?ASSESSMENT: ? ?CLINICAL IMPRESSION: ?Patient tolerated treatment well overall with no increase in pain by end of session. Continued with core and functional strengthening with progressions as tolerated. Plan to continue in similar manner as appropriate next session. Patient would benefit from continued management of limiting condition by skilled physical therapist to address remaining impairments and functional limitations to work towards stated goals and return to PLOF or maximal functional independence.   ? ?Patient is a 66 y.o. female referred to outpatient physical therapy with a medical diagnosis of other secondary scoliosis, lumbar region who presents with signs and symptoms consistent with acute on chronic bilateral low back pain with intermittent radiation to right LE below the knee in the context of levoscoliosis mainly at the lumbar region. Her referring provider has recommended major surgery later in the year for her low back.  Patient presents with significant pain, ROM, posture, joint stiffness, muscle tension, motor control, muscle performance (strength/power/endurance) and activity tolerance impairments that are limiting ability to  complete her usual activities such as prolonged positions, walking, sitting, working, hobbies, lifting, moving to a new house, going up and down stairs, etc  without difficulty and decreases her quality of lif

## 2021-11-03 ENCOUNTER — Ambulatory Visit: Payer: Medicare Other

## 2021-11-03 DIAGNOSIS — M25551 Pain in right hip: Secondary | ICD-10-CM

## 2021-11-03 DIAGNOSIS — R262 Difficulty in walking, not elsewhere classified: Secondary | ICD-10-CM

## 2021-11-03 DIAGNOSIS — M5459 Other low back pain: Secondary | ICD-10-CM | POA: Diagnosis not present

## 2021-11-03 DIAGNOSIS — M5416 Radiculopathy, lumbar region: Secondary | ICD-10-CM

## 2021-11-03 NOTE — Therapy (Signed)
?[facility] [place] [facility] ?[address] ?[place], [place], [address] ?Phone: [phone]   Fax:  [phone] ? ?OUTPATIENT PHYSICAL THERAPY TREATMENT NOTE ? ? ?Physical Therapy Treatment ? ?Patient Details  ?Name: Kathryn Whitehead ?MRN: [id number] ?Date of Birth: Jul 28, 1955 ?No data recorded ? ?Encounter Date: 11/03/2021 ? ? PT End of Session - 11/03/21 1704   ? ? Visit Number 6   ? Number of Visits 24   ? Date for PT Re-Evaluation 01/10/22   ? Authorization Type MEDICARE PART B reporting period from 10/18/2021   ? Authorization Time Period Cert 5/45/62-11/12/36   ? Progress Note Due on Visit 10   ? PT Start Time 1700   ? PT Stop Time 1740   ? PT Time Calculation (min) 40 min   ? Activity Tolerance Patient tolerated treatment well;No increased pain   ? Behavior During Therapy [facility] for tasks assessed/performed   ? ?  ?  ? ?  ? ? ?Past Medical History:  ?Diagnosis Date  ? Degenerative disc disease, lumbar   ? Difficult intubation   ? GERD (gastroesophageal reflux disease)   ? Hypercholesterolemia   ? Hypertension   ? Hypothyroidism   ? Late latent syphilis   ? Osteopenia   ? Reactive airway disease, moderate persistent, uncomplicated   ? Sinus bradycardia   ? Thyroid disease   ? ? ?Past Surgical History:  ?Procedure Laterality Date  ? ANTERIOR CERVICAL DECOMP/DISCECTOMY FUSION N/A 04/23/2019  ? Procedure: ANTERIOR CERVICAL DECOMPRESSION/DISCECTOMY FUSION 1 LEVEL C4-5;  Surgeon: Meade Maw, MD;  Location: [facility] ORS;  Service: Neurosurgery;  Laterality: N/A;  ? BACK SURGERY  1994  ? herniated disc removed lumbar area [facility])  ? [place]  ? COLONOSCOPY    ? COLONOSCOPY WITH PROPOFOL N/A 04/29/2021  ? Procedure: COLONOSCOPY WITH PROPOFOL;  Surgeon: Annamaria Helling, DO;  Location: [facility] ENDOSCOPY;  Service: Gastroenterology;  Laterality: N/A;  ? TONSILLECTOMY    ? TONSILLECTOMY AND ADENOIDECTOMY    ? teenager  ? TUBAL LIGATION    ? ? ?There were no  vitals filed for this visit. ? ? Subjective Assessment - 11/03/21 1702   ? ? Subjective Pt doing well today, no updates since last session. Her pain today is 2-3/10 central mid back.   ? Pertinent History Patient is a 66 y.o. female who presents to outpatient physical therapy with a referral for medical diagnosis other secondary scoliosis, lumbar region. This patient's chief complaints consist of low back pain with radiation to right LE knee and medial calf leading to the following functional deficits: difficulty with prolonged positions, walking, sitting, working, hobbies, lifting, moving to a new house, going up and down stairs, etc. Relevant past medical history and comorbidities include reactive airway disease, HTN, GERD, hx of cervical myelopathy with gait disruption (ACDF 04/22/2022 at C4-5), osteopenia, pure hypercholesterolemia, hypothyroidism, unexplained dropping things and stumbling, latex sensitivity.  Patient denies hx of cancer, stroke, seizures, heart problems, diabetes, unexplained weight loss, and unexplained changes in bowel or bladder problems. She has had some unexplained dropping things. She is planniing to have major surgery in her low back (possibly at mid thoracic to sacral fusion?) in November 2023.   ? Currently in Pain? Yes   ? Pain Score 3    central low back area  ? ?  ?  ? ?  ? ? ? ? ? ? ? PT Education - 11/03/21 1705   ? ? Education Details  Reviewed basics of form with pelvic tilt   ? Person(s) Educated Patient   ? Methods Explanation;Demonstration;Tactile cues   ? Comprehension Verbalized understanding;Need further instruction   ? ?  ?  ? ?  ? ? ? ?TODAY'S TREATMENT  ?Therapeutic exercise: to centralize symptoms and improve ROM, strength, muscular endurance, and activity tolerance required for successful completion of functional activities.  ?NuStep level 5 using bilateral upper and lower extremities. Seat/handle setting 6/8. For improved extremity mobility, muscular endurance, and  activity tolerance; and to induce the analgesic effect of aerobic exercise, stimulate improved joint nutrition, and prepare body structures and systems for following interventions. x 5  minutes. Average SPM = 73. (Feeling effort in legs).  ?Hooklying posterior pelvic tilt (PPT) with alternating LE extension, 2x10 each side. Cuing for improved PPT. ?Hooklying posterior pelvic tilt set with physio ball ?wand flexion? 1x10  ?Standing posterior pelvic tilt against wall with marching, 2x10 each side. ?Isometric pallof press marching in place 1x16 each direction  ? ? ?Superset 3: ?Seated lat pull, 3x10 with 20# ?Seated row progressing to without chest supported at [place], 3x10 at 10# ?Standing hip abduction at hip machine, 3x10 each side at 40#.  ?Squats with buttock touch on 18 inch chair with no UE support, 3x10  ? ?  ?Pt required multimodal cuing for proper technique and to facilitate improved neuromuscular control, strength, range of motion, and functional ability resulting in improved performance and form. ? ? ?GOALS: ?  ?SHORT TERM GOALS: Target date: 11/01/2021 ?  ?Patient will be independent with initial home exercise program for self-management of symptoms. ?Baseline: Initial HEP to be provided at visit 2 as appropriate (10/18/21); Initial HEP provided visit 2 (10/20/2021);  ?Goal status: achieved ?  ?  ?LONG TERM GOALS: Target date: 01/10/2022 ?  ?Patient will be independent with a long-term home exercise program for self-management of symptoms.  ?Baseline: Initial HEP to be provided at visit 2 as appropriate (10/18/21); initial HEP provided visit 2 (10/20/2021);  ?Goal status: IN PROGRESS ?  ?2.  Patient will demonstrate improved FOTO to equal or greater than 62 by visit #13 to demonstrate improvement in overall condition and self-reported functional ability.  ?Baseline: 50 (10/18/21); 53 at visit #5 (10/31/2021); ?Goal status: IN PROGRESS ?  ?3.  Reduce pain with functional activities to equal or less than  1/10 to allow patient to complete usual activities including housework, walking, bending with less difficulty.   ?Baseline: up to 9/10 (10/18/21); ?Goal status: IN PROGRESS ?  ?4.  Patient will demonstrate right hip strength equal or greater than left hip strength without increase in symptoms to improve ability to perform functional activities such as navigating stairs, walking, and lifting with less difficulty.  ?Baseline: 1/2 MMT grade lower on R than left and symptomatic in some motions - see objective exam (10/18/21); ?Goal status: IN PROGRESS ?  ?5.  Patient will complete community, work and/or recreational activities without limitation due to current condition.  ?Baseline: difficulty with prolonged positions, walking, sitting, working, hobbies, lifting, moving to a new house, going up and down stairs, etc.  (10/18/21); ?Goal status: IN PROGRESS ?  ?  ?  ?PLAN: ?PT FREQUENCY: 1-2x/week ?  ?PT DURATION: 12 weeks ?  ?PLANNED INTERVENTIONS: Therapeutic exercises, Therapeutic activity, Neuromuscular re-education, Balance training, Patient/Family education, Joint mobilization, Dry Needling, Electrical stimulation, Spinal mobilization, Cryotherapy, Moist heat, and Manual therapy. ?  ?PLAN FOR NEXT SESSION: update HEP as appropriate, progressive, core, hip and functional strengthening, manual therapy as  needed ?  ? ? ? Plan - 11/03/21 1705   ? ? Clinical Impression Statement Continued with core retraining and strengthening. Pain well controlled in session. Pt givens a brief recovery interval between exercises, but in general shows good form and confidence and signs of progression in strength and motor control.   ? PT Home Exercise Plan Access Code: [email or web address]  URL: [email or web address]  Date: 10/20/2021  Prepared by: Rosita Kea     Exercises  - Supine Transversus Abdominis Bracing with Leg Extension  - 3 x weekly - 3 sets - 20 reps  - Prone Hip Extension with Pillow Under Abdomen  - 3 x weekly - 3 sets -  20 reps   ? Consulted and Agree with Plan of Care Patient   ? ?  ?  ? ?  ? ? ? ? ? ? ?Patient will benefit from skilled therapeutic intervention in order to improve the following deficits and impairments

## 2021-11-07 ENCOUNTER — Ambulatory Visit: Payer: Medicare Other | Attending: Orthopaedic Surgery | Admitting: Physical Therapy

## 2021-11-07 ENCOUNTER — Encounter: Payer: Self-pay | Admitting: Physical Therapy

## 2021-11-07 DIAGNOSIS — R262 Difficulty in walking, not elsewhere classified: Secondary | ICD-10-CM | POA: Diagnosis present

## 2021-11-07 DIAGNOSIS — M5416 Radiculopathy, lumbar region: Secondary | ICD-10-CM | POA: Insufficient documentation

## 2021-11-07 DIAGNOSIS — M5459 Other low back pain: Secondary | ICD-10-CM | POA: Insufficient documentation

## 2021-11-07 DIAGNOSIS — M25551 Pain in right hip: Secondary | ICD-10-CM | POA: Diagnosis present

## 2021-11-07 NOTE — Therapy (Signed)
?OUTPATIENT PHYSICAL THERAPY TREATMENT NOTE ? ? ?Patient Name: Kathryn Whitehead ?MRN: [id number] ?DOB:05/12/1956, 66 y.o., female ?Today's Date: 11/07/2021 ? ? PT End of Session - 11/07/21 1814   ? ? Visit Number 7   ? Number of Visits 24   ? Date for PT Re-Evaluation 01/10/22   ? Authorization Type MEDICARE PART B reporting period from 10/18/2021   ? Authorization Time Period Cert 3/61/44-09/07/52   ? Progress Note Due on Visit 10   ? PT Start Time 1735   ? PT Stop Time 0086   ? PT Time Calculation (min) 40 min   ? Activity Tolerance Patient tolerated treatment well;No increased pain   ? Behavior During Therapy [facility] for tasks assessed/performed   ? ?  ?  ? ?  ? ? ? ? ? ? ? ?Past Medical History:  ?Diagnosis Date  ? Degenerative disc disease, lumbar   ? Difficult intubation   ? GERD (gastroesophageal reflux disease)   ? Hypercholesterolemia   ? Hypertension   ? Hypothyroidism   ? Late latent syphilis   ? Osteopenia   ? Reactive airway disease, moderate persistent, uncomplicated   ? Sinus bradycardia   ? Thyroid disease   ? ?Past Surgical History:  ?Procedure Laterality Date  ? ANTERIOR CERVICAL DECOMP/DISCECTOMY FUSION N/A 04/23/2019  ? Procedure: ANTERIOR CERVICAL DECOMPRESSION/DISCECTOMY FUSION 1 LEVEL C4-5;  Surgeon: Meade Maw, MD;  Location: [facility] ORS;  Service: Neurosurgery;  Laterality: N/A;  ? BACK SURGERY  1994  ? herniated disc removed lumbar area [facility])  ? [place]  ? COLONOSCOPY    ? COLONOSCOPY WITH PROPOFOL N/A 04/29/2021  ? Procedure: COLONOSCOPY WITH PROPOFOL;  Surgeon: Annamaria Helling, DO;  Location: [facility] ENDOSCOPY;  Service: Gastroenterology;  Laterality: N/A;  ? TONSILLECTOMY    ? TONSILLECTOMY AND ADENOIDECTOMY    ? teenager  ? TUBAL LIGATION    ? ?Patient Active Problem List  ? Diagnosis Date Noted  ? Cervical myelopathy ([place]) 04/23/2019  ? Osteopenia of multiple sites 10/23/2018  ? Late latent syphilis 01/08/2018  ? Sinus bradycardia by electrocardiogram 11/07/2017  ?  RAD (reactive airway disease), moderate persistent, uncomplicated 76/19/5093  ? Chronic midline low back pain without sciatica 09/05/2016  ? Hot flashes, menopausal 09/05/2016  ? Mid back pain on left side 09/05/2016  ? Postmenopausal 03/30/2016  ? Gastroesophageal reflux disease without esophagitis 12/24/2014  ? Degenerative disc disease, lumbar 12/24/2014  ? Acquired hypothyroidism 09/12/2014  ? Benign essential hypertension 09/12/2014  ? Pure hypercholesterolemia 09/12/2014  ? ? ?PCP: Marinda Elk, MD ? ?REFERRING PROVIDER: Starling Manns, MD ? ?REFERRING DIAG: secondary scoliosis, lumbar region  ? ?THERAPY DIAG:  ?Other low back pain ? ?Radiculopathy, lumbar region ? ?Pain in right hip ? ?Difficulty in walking, not elsewhere classified ? ?ONSET DATE: chronic from about 1994, but several months ago got worse ? ?PERTINENT HISTORY: Patient is a 66 y.o. female who presents to outpatient physical therapy with a referral for medical diagnosis other secondary scoliosis, lumbar region. This patient's chief complaints consist of low back pain with radiation to right LE knee and medial calf leading to the following functional deficits: difficulty with prolonged positions, walking, sitting, working, hobbies, lifting, moving to a new house, going up and down stairs, etc. Relevant past medical history and comorbidities include reactive airway disease, HTN, GERD, hx of cervical myelopathy with gait disruption (ACDF 04/22/2022 at C4-5), osteopenia, pure hypercholesterolemia, hypothyroidism, unexplained dropping things and stumbling, latex sensitivity.  Patient denies  hx of cancer, stroke, seizures, heart problems, diabetes, unexplained weight loss, and unexplained changes in bowel or bladder problems. She has had some unexplained dropping things. She is planniing to have major surgery in her low back (possibly at mid thoracic to sacral fusion?) in November 2023.  ? ?PRECAUTIONS: none ? ?PATIENT GOALS: "to try to reduce  some of the pain" ? ?SUBJECTIVE:  Patient states she is feeling well and has no pain upon arrival. She state she had one twinge of pain while sitting at work today. She states she did her HEP this weekend and felt fine after last PT session.  ? ?PAIN:  ?Are you having pain? no ?   ?OBJECTIVE ?                                                                                                                                                                                   ?TODAY'S TREATMENT  ?Therapeutic exercise: to centralize symptoms and improve ROM, strength, muscular endurance, and activity tolerance required for successful completion of functional activities.  ?NuStep level 5 using bilateral upper and lower extremities. Seat/handle setting 6/8. For improved extremity mobility, muscular endurance, and activity tolerance; and to induce the analgesic effect of aerobic exercise, stimulate improved joint nutrition, and prepare body structures and systems for following interventions. x 5  minutes. Average SPM = 73.  ?Hooklying posterior pelvic tilt set with physio ball ?wand flexion? 1x10  ?Hooklying posterior pelvic tilt set with physio ball ?wand flexion? with alternating LE extension. 2x10  each side.  ?Standing posterior pelvic tilt against wall with marching, 1x10 each side. ?Isometric pallof press with YTB loop while marching in place, 2x10 each direction  ?Squats with buttock touch on 17 inch chair with no UE support, 3x10 ? ?Superset: ?Seated lat pull, 3x10 with 20/25/25# ?Seated row without chest supported at [place], 3x10 at 10# ? ?Standing hip abduction at hip machine, 2x10 each side at 40#. Mild PPT. ? ?Pt required multimodal cuing for proper technique and to facilitate improved neuromuscular control, strength, range of motion, and functional ability resulting in improved performance and form. ? ? ?PATIENT EDUCATION:  ?Education details: Exercise purpose/form. Self management techniques. Education on  diagnosis, prognosis, POC, anatomy and physiology of current condition  ?Person educated: Patient ?Education method: Explanation and Demonstration ?Education comprehension: verbalized understanding and needs further education ? ? ?HOME EXERCISE PROGRAM: ?Access Code: [email or web address] ?URL: [email or web address] ?Date: 10/20/2021 ?Prepared by: Rosita Kea ? ?Exercises ?- Supine Transversus Abdominis Bracing with Leg Extension  - 3 x weekly - 3 sets - 20 reps ?- Prone Hip Extension with Pillow Under Abdomen  - 3 x weekly - 3 sets - 20  reps ? ?ASSESSMENT: ? ?CLINICAL IMPRESSION: ?Patient tolerated treatment well overall with no increase in pain. Patient continued working on core and functional strengthening and was able to make progressions on several exercises. Plan to continue with similar strengthening next visit as tolerated. Patient would benefit from continued management of limiting condition by skilled physical therapist to address remaining impairments and functional limitations to work towards stated goals and return to PLOF or maximal functional independence.  ? ? ?Patient is a 66 y.o. female referred to outpatient physical therapy with a medical diagnosis of other secondary scoliosis, lumbar region who presents with signs and symptoms consistent with acute on chronic bilateral low back pain with intermittent radiation to right LE below the knee in the context of levoscoliosis mainly at the lumbar region. Her referring provider has recommended major surgery later in the year for her low back.  Patient presents with significant pain, ROM, posture, joint stiffness, muscle tension, motor control, muscle performance (strength/power/endurance) and activity tolerance impairments that are limiting ability to complete her usual activities such as prolonged positions, walking, sitting, working, hobbies, lifting, moving to a new house, going up and down stairs, etc  without difficulty and decreases her quality of  life. Patient will benefit from skilled physical therapy intervention to address current body structure impairments and activity limitations to improve function and work towards goals set in current POC

## 2021-11-10 ENCOUNTER — Ambulatory Visit: Payer: Medicare Other | Admitting: Physical Therapy

## 2021-11-10 ENCOUNTER — Encounter: Payer: Self-pay | Admitting: Physical Therapy

## 2021-11-10 DIAGNOSIS — M5459 Other low back pain: Secondary | ICD-10-CM

## 2021-11-10 DIAGNOSIS — M5416 Radiculopathy, lumbar region: Secondary | ICD-10-CM

## 2021-11-10 DIAGNOSIS — R262 Difficulty in walking, not elsewhere classified: Secondary | ICD-10-CM

## 2021-11-10 DIAGNOSIS — M25551 Pain in right hip: Secondary | ICD-10-CM

## 2021-11-10 NOTE — Therapy (Signed)
?OUTPATIENT PHYSICAL THERAPY TREATMENT NOTE ? ? ?Patient Name: Kathryn Whitehead ?MRN: [id number] ?DOB:25-May-1956, 66 y.o., female ?Today's Date: 11/10/2021 ? ? PT End of Session - 11/10/21 1740   ? ? Visit Number 8   ? Number of Visits 24   ? Date for PT Re-Evaluation 01/10/22   ? Authorization Type MEDICARE PART B reporting period from 10/18/2021   ? Authorization Time Period Cert 2/67/12-10/13/78   ? Progress Note Due on Visit 10   ? PT Start Time 9983   ? PT Stop Time 3825   ? PT Time Calculation (min) 40 min   ? Activity Tolerance Patient tolerated treatment well;No increased pain   ? Behavior During Therapy [facility] for tasks assessed/performed   ? ?  ?  ? ?  ? ? ? ? ? ? ? ? ?Past Medical History:  ?Diagnosis Date  ? Degenerative disc disease, lumbar   ? Difficult intubation   ? GERD (gastroesophageal reflux disease)   ? Hypercholesterolemia   ? Hypertension   ? Hypothyroidism   ? Late latent syphilis   ? Osteopenia   ? Reactive airway disease, moderate persistent, uncomplicated   ? Sinus bradycardia   ? Thyroid disease   ? ?Past Surgical History:  ?Procedure Laterality Date  ? ANTERIOR CERVICAL DECOMP/DISCECTOMY FUSION N/A 04/23/2019  ? Procedure: ANTERIOR CERVICAL DECOMPRESSION/DISCECTOMY FUSION 1 LEVEL C4-5;  Surgeon: Meade Maw, MD;  Location: [facility] ORS;  Service: Neurosurgery;  Laterality: N/A;  ? BACK SURGERY  1994  ? herniated disc removed lumbar area [facility])  ? [facility]  ? COLONOSCOPY    ? COLONOSCOPY WITH PROPOFOL N/A 04/29/2021  ? Procedure: COLONOSCOPY WITH PROPOFOL;  Surgeon: Annamaria Helling, DO;  Location: [facility] ENDOSCOPY;  Service: Gastroenterology;  Laterality: N/A;  ? TONSILLECTOMY    ? TONSILLECTOMY AND ADENOIDECTOMY    ? teenager  ? TUBAL LIGATION    ? ?Patient Active Problem List  ? Diagnosis Date Noted  ? Cervical myelopathy ([place]) 04/23/2019  ? Osteopenia of multiple sites 10/23/2018  ? Late latent syphilis 01/08/2018  ? Sinus bradycardia by electrocardiogram 11/07/2017  ?  RAD (reactive airway disease), moderate persistent, uncomplicated 05/39/7673  ? Chronic midline low back pain without sciatica 09/05/2016  ? Hot flashes, menopausal 09/05/2016  ? Mid back pain on left side 09/05/2016  ? Postmenopausal 03/30/2016  ? Gastroesophageal reflux disease without esophagitis 12/24/2014  ? Degenerative disc disease, lumbar 12/24/2014  ? Acquired hypothyroidism 09/12/2014  ? Benign essential hypertension 09/12/2014  ? Pure hypercholesterolemia 09/12/2014  ? ? ?PCP: Marinda Elk, MD ? ?REFERRING PROVIDER: Starling Manns, MD ? ?REFERRING DIAG: secondary scoliosis, lumbar region  ? ?THERAPY DIAG:  ?Other low back pain ? ?Radiculopathy, lumbar region ? ?Pain in right hip ? ?Difficulty in walking, not elsewhere classified ? ?ONSET DATE: chronic from about 1994, but several months ago got worse ? ?PERTINENT HISTORY: Patient is a 66 y.o. female who presents to outpatient physical therapy with a referral for medical diagnosis other secondary scoliosis, lumbar region. This patient's chief complaints consist of low back pain with radiation to right LE knee and medial calf leading to the following functional deficits: difficulty with prolonged positions, walking, sitting, working, hobbies, lifting, moving to a new house, going up and down stairs, etc. Relevant past medical history and comorbidities include reactive airway disease, HTN, GERD, hx of cervical myelopathy with gait disruption (ACDF 04/22/2022 at C4-5), osteopenia, pure hypercholesterolemia, hypothyroidism, unexplained dropping things and stumbling, latex sensitivity.  Patient  denies hx of cancer, stroke, seizures, heart problems, diabetes, unexplained weight loss, and unexplained changes in bowel or bladder problems. She has had some unexplained dropping things. She is planniing to have major surgery in her low back (possibly at mid thoracic to sacral fusion?) in November 2023.  ? ?PRECAUTIONS: none ? ?PATIENT GOALS: "to try to reduce  some of the pain" ? ?SUBJECTIVE:  Patient reports her back is not hurting now but it has been achy on and off throughout the day.  ? ?PAIN:  ?Are you having pain? no ?   ?OBJECTIVE ?                                                                                                                                                                                   ?TODAY'S TREATMENT  ?Therapeutic exercise: to centralize symptoms and improve ROM, strength, muscular endurance, and activity tolerance required for successful completion of functional activities.  ?NuStep level 5 using bilateral upper and lower extremities. Seat/handle setting 6/8. For improved extremity mobility, muscular endurance, and activity tolerance; and to induce the analgesic effect of aerobic exercise, stimulate improved joint nutrition, and prepare body structures and systems for following interventions. x 6:16  minutes. Average SPM = 68.  ?Hooklying posterior pelvic tilt set with physio ball ?wand flexion? with alternating LE extension. 1x10  each side (easy) ?- hooklying PPT with bilateral foot lift off of platform to reverse curl position, 1x10 with 5 second holds.  ?Hooklying PPT with march up and down from reverse curl position, 1x10 with each LE leading.  ?Squats with buttock touch on 17 inch chair while holding 4.4# med ball at 90 degrees shoulder flexion, 3x10 (mild increase in pain) ?Isometric pallof press with YTB loop while marching in place, 2x10 each direction (reported increasing pain after first set with anchor on left so stopped to perform lumbar flexion roll out first).  ?Seated lumbar flexion roll out with green theraball, self-selected holds, 1x2 min. ? ?Superset: ?Seated lat pull, 3x10 with 25# ?Seated row without chest supported at [place], 3x10 at 10# ? ? ?- Education on HEP including handout  ? ? ?Pt required multimodal cuing for proper technique and to facilitate improved neuromuscular control, strength, range of motion,  and functional ability resulting in improved performance and form. ? ? ?PATIENT EDUCATION:  ?Education details: Exercise purpose/form. Self management techniques. Education on diagnosis, prognosis, POC, anatomy and physiology of current condition  ?Person educated: Patient ?Education method: Explanation and Demonstration ?Education comprehension: verbalized understanding and needs further education ? ? ?HOME EXERCISE PROGRAM: ?Access Code: [email or web address] ?URL: [email or web address] ?Date: 11/10/2021 ?Prepared by: Rosita Kea ? ?Exercises ?- Sahrmann Level 1 - Supine  90-90 Leg Lifts and Lowers with Hip >90  - 3 x weekly - 3 sets - 10 reps ?- Prone Hip Extension with Pillow Under Abdomen  - 3 x weekly - 3 sets - 20 reps ? ?ASSESSMENT: ? ?CLINICAL IMPRESSION: ?Patient tolerated treatment well overall with no increase in pain by end of session. Did have some pain after squats and starting pallof marches but this resolved with lumbar flexion roll out. Updated HEP to include march up and down from reverse curl position. Patient continues to make progress towards goals. Plan to continue with progressive core and functional strengthening as appropriate next session. Patient would benefit from continued management of limiting condition by skilled physical therapist to address remaining impairments and functional limitations to work towards stated goals and return to PLOF or maximal functional independence.  ? ?Patient is a 66 y.o. female referred to outpatient physical therapy with a medical diagnosis of other secondary scoliosis, lumbar region who presents with signs and symptoms consistent with acute on chronic bilateral low back pain with intermittent radiation to right LE below the knee in the context of levoscoliosis mainly at the lumbar region. Her referring provider has recommended major surgery later in the year for her low back.  Patient presents with significant pain, ROM, posture, joint stiffness, muscle  tension, motor control, muscle performance (strength/power/endurance) and activity tolerance impairments that are limiting ability to complete her usual activities such as prolonged positions, walking,

## 2021-11-14 ENCOUNTER — Ambulatory Visit: Payer: Medicare Other | Admitting: Physical Therapy

## 2021-11-14 DIAGNOSIS — M5459 Other low back pain: Secondary | ICD-10-CM | POA: Diagnosis not present

## 2021-11-14 DIAGNOSIS — M25551 Pain in right hip: Secondary | ICD-10-CM

## 2021-11-14 DIAGNOSIS — M5416 Radiculopathy, lumbar region: Secondary | ICD-10-CM

## 2021-11-14 DIAGNOSIS — R262 Difficulty in walking, not elsewhere classified: Secondary | ICD-10-CM

## 2021-11-14 NOTE — Therapy (Addendum)
?OUTPATIENT PHYSICAL THERAPY TREATMENT NOTE ? ? ?Patient Name: Kathryn Whitehead ?MRN: [id number] ?DOB:[birth year], 66 y.o., female ?Today's Date: 11/15/2021 ? ? PT End of Session - 11/15/21 1126   ? ? Visit Number 9   ? Number of Visits 24   ? Date for PT Re-Evaluation 01/10/22   ? Authorization Type MEDICARE PART B reporting period from 10/18/2021   ? Authorization Time Period Cert 01/08/76-03/12/89   ? Progress Note Due on Visit 10   ? PT Start Time 3009   ? PT Stop Time 2330   ? PT Time Calculation (min) 40 min   ? Activity Tolerance Patient tolerated treatment well;No increased pain   ? Behavior During Therapy [facility] for tasks assessed/performed   ? ?  ?  ? ?  ? ? ? ? ? ? ? ? ? ?Past Medical History:  ?Diagnosis Date  ? Degenerative disc disease, lumbar   ? Difficult intubation   ? GERD (gastroesophageal reflux disease)   ? Hypercholesterolemia   ? Hypertension   ? Hypothyroidism   ? Late latent syphilis   ? Osteopenia   ? Reactive airway disease, moderate persistent, uncomplicated   ? Sinus bradycardia   ? Thyroid disease   ? ?Past Surgical History:  ?Procedure Laterality Date  ? ANTERIOR CERVICAL DECOMP/DISCECTOMY FUSION N/A 04/23/2019  ? Procedure: ANTERIOR CERVICAL DECOMPRESSION/DISCECTOMY FUSION 1 LEVEL C4-5;  Surgeon: Meade Maw, MD;  Location: [facility] ORS;  Service: Neurosurgery;  Laterality: N/A;  ? BACK SURGERY  1994  ? herniated disc removed lumbar area [facility]  ? [place]  ? COLONOSCOPY    ? COLONOSCOPY WITH PROPOFOL N/A 04/29/2021  ? Procedure: COLONOSCOPY WITH PROPOFOL;  Surgeon: Annamaria Helling, DO;  Location: [facility] ENDOSCOPY;  Service: Gastroenterology;  Laterality: N/A;  ? TONSILLECTOMY    ? TONSILLECTOMY AND ADENOIDECTOMY    ? teenager  ? TUBAL LIGATION    ? ?Patient Active Problem List  ? Diagnosis Date Noted  ? Cervical myelopathy ([place]) 04/23/2019  ? Osteopenia of multiple sites 10/23/2018  ? Late latent syphilis 01/08/2018  ? Sinus bradycardia by electrocardiogram 11/07/2017   ? RAD (reactive airway disease), moderate persistent, uncomplicated 07/62/2633  ? Chronic midline low back pain without sciatica 09/05/2016  ? Hot flashes, menopausal 09/05/2016  ? Mid back pain on left side 09/05/2016  ? Postmenopausal 03/30/2016  ? Gastroesophageal reflux disease without esophagitis 12/24/2014  ? Degenerative disc disease, lumbar 12/24/2014  ? Acquired hypothyroidism 09/12/2014  ? Benign essential hypertension 09/12/2014  ? Pure hypercholesterolemia 09/12/2014  ? ? ?PCP: Marinda Elk, MD ? ?REFERRING PROVIDER: Starling Manns, MD ? ?REFERRING DIAG: secondary scoliosis, lumbar region  ? ?THERAPY DIAG:  ?Other low back pain ? ?Radiculopathy, lumbar region ? ?Pain in right hip ? ?Difficulty in walking, not elsewhere classified ? ?ONSET DATE: chronic from about 1994, but several months ago got worse ? ?PERTINENT HISTORY: Patient is a 66 y.o. female who presents to outpatient physical therapy with a referral for medical diagnosis other secondary scoliosis, lumbar region. This patient's chief complaints consist of low back pain with radiation to right LE knee and medial calf leading to the following functional deficits: difficulty with prolonged positions, walking, sitting, working, hobbies, lifting, moving to a new house, going up and down stairs, etc. Relevant past medical history and comorbidities include reactive airway disease, HTN, GERD, hx of cervical myelopathy with gait disruption (ACDF 04/22/2022 at C4-5), osteopenia, pure hypercholesterolemia, hypothyroidism, unexplained dropping things and stumbling, latex sensitivity.  Patient denies hx of cancer, stroke, seizures, heart problems, diabetes, unexplained weight loss, and unexplained changes in bowel or bladder problems. She has had some unexplained dropping things. She is planniing to have major surgery in her low back (possibly at mid thoracic to sacral fusion?) in November 2023.  ? ?PRECAUTIONS: none ? ?PATIENT GOALS: "to try to  reduce some of the pain" ? ?SUBJECTIVE:  Patient reports she is feeling well and felt okay after her last PT session but has had a lot of intermittent pain today that she thinks is related to starting back to some of her previous fitness routine yesterday that she hasn't done since before she moved.   ? ?PAIN:  ?Are you having pain? no ?   ?OBJECTIVE ?                                                                                                                                                                                   ?TODAY'S TREATMENT  ?Therapeutic exercise: to centralize symptoms and improve ROM, strength, muscular endurance, and activity tolerance required for successful completion of functional activities.  ?NuStep level 5 using bilateral upper and lower extremities. Seat/handle setting 6/8. For improved extremity mobility, muscular endurance, and activity tolerance; and to induce the analgesic effect of aerobic exercise, stimulate improved joint nutrition, and prepare body structures and systems for following interventions. x 5  minutes. Average SPM = 74.  ?- Seated lumbar flexion roll out with green theraball, self-selected holds, 1x2 min. ?- seated on theraball, trunk twists with 3kg med ball, 3x10 each direction (started with elbows extended and sholders flexed to 90 degrees but bothered R shoulder so lowered arms and flexed elbows some). SBA ?- seated marching on theraball, with 1-2 second holds, 3x10 each side. SBA-min A to prevent fall.  ?- Squats with buttock touch on 17 inch chair while holding 4.4# med ball at 60 degrees shoulder flexion, 3x10.  ?- Hooklying PPT with march up and down from reverse curl position, 1x10 with each LE leading. 1x5 with each LE leading.  ?- Isometric pallof press with YTB loop while marching in place, 2x10 each direction  ?- Seated lumbar flexion roll out with green theraball, self-selected holds, 1x2 min. ? ?Superset: ?Seated lat pull, 3x10 with 25# ?Seated row  without chest supported at [place], 2x10 at 15# ? ?Pt required multimodal cuing for proper technique and to facilitate improved neuromuscular control, strength, range of motion, and functional ability resulting in improved performance and form. ? ? ?PATIENT EDUCATION:  ?Education details: Exercise purpose/form. Self management techniques. Education on diagnosis, prognosis, POC, anatomy and physiology of current condition  ?Person educated: Patient ?Education method: Explanation  and Demonstration ?Education comprehension: verbalized understanding and needs further education ? ? ?HOME EXERCISE PROGRAM: ?Access Code: [email or web address] ?URL: [email or web address] ?Date: 11/10/2021 ?Prepared by: Rosita Kea ? ?Exercises ?- Sahrmann Level 1 - Supine 90-90 Leg Lifts and Lowers with Hip >90  - 3 x weekly - 3 sets - 10 reps ?- Prone Hip Extension with Pillow Under Abdomen  - 3 x weekly - 3 sets - 20 reps ? ?ASSESSMENT: ? ?CLINICAL IMPRESSION: ?Patient tolerated treatment well overall with minimal to no increase in pain by end of session. Found standing pallof marching the most irritating exercise but improved with seated exercises afterwards. Plan to continue core and functional strengthening as tolerated next session.Patient would benefit from continued management of limiting condition by skilled physical therapist to address remaining impairments and functional limitations to work towards stated goals and return to PLOF or maximal functional independence.  ? ?Patient is a 66 y.o. female referred to outpatient physical therapy with a medical diagnosis of other secondary scoliosis, lumbar region who presents with signs and symptoms consistent with acute on chronic bilateral low back pain with intermittent radiation to right LE below the knee in the context of levoscoliosis mainly at the lumbar region. Her referring provider has recommended major surgery later in the year for her low back.  Patient presents with  significant pain, ROM, posture, joint stiffness, muscle tension, motor control, muscle performance (strength/power/endurance) and activity tolerance impairments that are limiting ability to complete her usual a

## 2021-11-15 ENCOUNTER — Encounter: Payer: Self-pay | Admitting: Physical Therapy

## 2021-11-16 NOTE — Therapy (Signed)
?OUTPATIENT PHYSICAL THERAPY TREATMENT / PROGRESS NOTE ?Dates of reporting from 10/18/2021 to 11/17/2021 ? ? ?Patient Name: Kathryn Whitehead ?MRN: [id number] ?DOB:[birth year], 66 y.o., female ?Today's Date: 11/17/2021 ? ? PT End of Session - 11/17/21 1945   ? ? Visit Number 10   ? Number of Visits 24   ? Date for PT Re-Evaluation 01/10/22   ? Authorization Type MEDICARE PART B reporting period from 10/18/2021   ? Authorization Time Period Cert 10/17/79-07/18/12   ? Progress Note Due on Visit 10   ? PT Start Time 7829   ? PT Stop Time 5621   ? PT Time Calculation (min) 38 min   ? Activity Tolerance Patient tolerated treatment well   ? Behavior During Therapy [facility] for tasks assessed/performed   ? ?  ?  ? ?  ? ? ? ? ? ? ? ? ? ? ?Past Medical History:  ?Diagnosis Date  ? Degenerative disc disease, lumbar   ? Difficult intubation   ? GERD (gastroesophageal reflux disease)   ? Hypercholesterolemia   ? Hypertension   ? Hypothyroidism   ? Late latent syphilis   ? Osteopenia   ? Reactive airway disease, moderate persistent, uncomplicated   ? Sinus bradycardia   ? Thyroid disease   ? ?Past Surgical History:  ?Procedure Laterality Date  ? ANTERIOR CERVICAL DECOMP/DISCECTOMY FUSION N/A 04/23/2019  ? Procedure: ANTERIOR CERVICAL DECOMPRESSION/DISCECTOMY FUSION 1 LEVEL C4-5;  Surgeon: Meade Maw, MD;  Location: [facility] ORS;  Service: Neurosurgery;  Laterality: N/A;  ? BACK SURGERY  1994  ? herniated disc removed lumbar area [facility])  ? [place]  ? COLONOSCOPY    ? COLONOSCOPY WITH PROPOFOL N/A 04/29/2021  ? Procedure: COLONOSCOPY WITH PROPOFOL;  Surgeon: Annamaria Helling, DO;  Location: [facility] ENDOSCOPY;  Service: Gastroenterology;  Laterality: N/A;  ? TONSILLECTOMY    ? TONSILLECTOMY AND ADENOIDECTOMY    ? teenager  ? TUBAL LIGATION    ? ?Patient Active Problem List  ? Diagnosis Date Noted  ? Cervical myelopathy ([place]) 04/23/2019  ? Osteopenia of multiple sites 10/23/2018  ? Late latent syphilis 01/08/2018  ? Sinus  bradycardia by electrocardiogram 11/07/2017  ? RAD (reactive airway disease), moderate persistent, uncomplicated 30/86/5784  ? Chronic midline low back pain without sciatica 09/05/2016  ? Hot flashes, menopausal 09/05/2016  ? Mid back pain on left side 09/05/2016  ? Postmenopausal 03/30/2016  ? Gastroesophageal reflux disease without esophagitis 12/24/2014  ? Degenerative disc disease, lumbar 12/24/2014  ? Acquired hypothyroidism 09/12/2014  ? Benign essential hypertension 09/12/2014  ? Pure hypercholesterolemia 09/12/2014  ? ? ?PCP: Marinda Elk, MD ? ?REFERRING PROVIDER: Starling Manns, MD ? ?REFERRING DIAG: secondary scoliosis, lumbar region  ? ?THERAPY DIAG:  ?Other low back pain ? ?Radiculopathy, lumbar region ? ?Pain in right hip ? ?Difficulty in walking, not elsewhere classified ? ?ONSET DATE: chronic from about 1994, but several months ago got worse ? ?PERTINENT HISTORY: Patient is a 66 y.o. female who presents to outpatient physical therapy with a referral for medical diagnosis other secondary scoliosis, lumbar region. This patient's chief complaints consist of low back pain with radiation to right LE knee and medial calf leading to the following functional deficits: difficulty with prolonged positions, walking, sitting, working, hobbies, lifting, moving to a new house, going up and down stairs, etc. Relevant past medical history and comorbidities include reactive airway disease, HTN, GERD, hx of cervical myelopathy with gait disruption (ACDF 04/22/2022 at C4-5), osteopenia, pure hypercholesterolemia, hypothyroidism,  unexplained dropping things and stumbling, latex sensitivity.  Patient denies hx of cancer, stroke, seizures, heart problems, diabetes, unexplained weight loss, and unexplained changes in bowel or bladder problems. She has had some unexplained dropping things. She is planniing to have major surgery in her low back (possibly at mid thoracic to sacral fusion?) in November 2023.   ? ?PRECAUTIONS: none ? ?PATIENT GOALS: "to try to reduce some of the pain" ? ?SUBJECTIVE:  Patient reports she is feeling well but has had pain on and off in her low back and numbness in her right knee. She states she felt okay after last PT session. HEP is going well. Patient reports she feels PT is helping a little. She states it is helping get her muscles toned up. Patient states she returns to her referring clinician tomorrow to discuss results of bone density study to prepare for surgery.  ? ?PAIN:  ?Are you having pain? Yes, 1x10 in middle low back.  ?Worst pain in last 2 weeks: 5/10 ?   ?OBJECTIVE ? ?SELF-REPORTED FUNCTION ?FOTO score: 42/100 (lumbar spine questionnaire) ? ? MUSCLE PERFORMANCE (MMT):  ?*Indicates pain 10/18/21 11/17/21 Date  ?Joint/Motion R/L R/L R/L  ?Hip        ?Flexion (L1, L2) 4+/4+ 5/4+ /  ?Extension (knee ext) 4/4+ 4+/4+ /  ?Abduction 3+/4 4+/4 /  ?Knee        ?Extension (L3) 5/5 / /  ?Flexion (S2) 5/5 / /  ?Ankle/Foot        ?Dorsiflexion (L4) 4+/5 / /  ?Great toe extension (L5) 5/5 / /  ?Eversion (S1) 5/4+ / /  ?Plantarflexion (S1) 4+/4+ / /  ?Comments: muscle cramp at left upper abdominal quadrant with R hip flexion. Cramping at R hip adductors with R hip abduction test.  ?                                                                                                                                                                                   ?TODAY'S TREATMENT  ?Therapeutic exercise: to centralize symptoms and improve ROM, strength, muscular endurance, and activity tolerance required for successful completion of functional activities.  ?NuStep level 5 using bilateral upper and lower extremities. Seat/handle setting 6/8. For improved extremity mobility, muscular endurance, and activity tolerance; and to induce the analgesic effect of aerobic exercise, stimulate improved joint nutrition, and prepare body structures and systems for following interventions. x 5  minutes. Average  SPM = 69.  ?- MMT to assess progress (see above) ?(Manual therapy - see below).  ?- Squats with buttock touch on 17 inch chair while holding 2kg med ball at 90 degrees shoulder flexion, 3x10.  ?- seated  marching on clear theraball, with 1-2 second holds, 3x10 each side. SBA-min A to prevent fall.  ?- Isometric pallof press with YTB loop while marching in place, 3x10 each direction  ?Seated row without chest supported at [place], 3x10 at 15# ? ?Manual therapy: to reduce pain and tissue tension, improve range of motion, neuromodulation, in order to promote improved ability to complete functional activities.  ?PRONE ?- STM to bilateral lumbar and lower thoracic paraspinals and quadratus lumborum (tender bilaterally).  ? ?Pt required multimodal cuing for proper technique and to facilitate improved neuromuscular control, strength, range of motion, and functional ability resulting in improved performance and form. ? ? ?PATIENT EDUCATION:  ?Education details: Exercise purpose/form. Self management techniques. Education on diagnosis, prognosis, POC, anatomy and physiology of current condition  ?Person educated: Patient ?Education method: Explanation and Demonstration ?Education comprehension: verbalized understanding and needs further education ? ? ?HOME EXERCISE PROGRAM: ?Access Code: [email or web address] ?URL: [email or web address] ?Date: 11/10/2021 ?Prepared by: Rosita Kea ? ?Exercises ?- Sahrmann Level 1 - Supine 90-90 Leg Lifts and Lowers with Hip >90  - 3 x weekly - 3 sets - 10 reps ?- Prone Hip Extension with Pillow Under Abdomen  - 3 x weekly - 3 sets - 20 reps ? ?ASSESSMENT: ? ?CLINICAL IMPRESSION: ?Patient has attended 10 physical therapy sessions since starting her current episode of care on 10/20/2021. She is making progress towards strength, HEP, and pain goals, but has not shown any improvements in FOTO score (self-reported function) or reported difficulty with functional activities. Patient is  planning for surgery in the future and working on getting stronger to prepare for surgery. She continues to have muscle performance (strength/power/endurance), pain, paresthesia, and activity tolerance deficits that are a

## 2021-11-17 ENCOUNTER — Encounter: Payer: Self-pay | Admitting: Physical Therapy

## 2021-11-17 ENCOUNTER — Ambulatory Visit: Payer: Medicare Other | Admitting: Physical Therapy

## 2021-11-17 DIAGNOSIS — M5416 Radiculopathy, lumbar region: Secondary | ICD-10-CM

## 2021-11-17 DIAGNOSIS — M5459 Other low back pain: Secondary | ICD-10-CM

## 2021-11-17 DIAGNOSIS — M25551 Pain in right hip: Secondary | ICD-10-CM

## 2021-11-17 DIAGNOSIS — R262 Difficulty in walking, not elsewhere classified: Secondary | ICD-10-CM

## 2021-11-17 NOTE — Therapy (Signed)
?OUTPATIENT PHYSICAL THERAPY TREATMENT NOTE ? ? ?Patient Name: Kathryn Whitehead ?MRN: [id number] ?DOB:[birth year], 66 y.o., female ?Today's Date: 11/21/2021 ? ? PT End of Session - 11/21/21 1713   ? ? Visit Number 10   ? Number of Visits 24   ? Date for PT Re-Evaluation 01/10/22   ? Authorization Type MEDICARE PART B reporting period from 11/17/2021   ? Authorization Time Period Cert 12/27/33-11/15/72   ? Progress Note Due on Visit 20   ? PT Start Time 1638   ? PT Stop Time 4536   ? PT Time Calculation (min) 40 min   ? Activity Tolerance Patient tolerated treatment well   ? Behavior During Therapy [facility] for tasks assessed/performed   ? ?  ?  ? ?  ? ? ? ? ? ? ? ? ? ? ? ?Past Medical History:  ?Diagnosis Date  ? Degenerative disc disease, lumbar   ? Difficult intubation   ? GERD (gastroesophageal reflux disease)   ? Hypercholesterolemia   ? Hypertension   ? Hypothyroidism   ? Late latent syphilis   ? Osteopenia   ? Reactive airway disease, moderate persistent, uncomplicated   ? Sinus bradycardia   ? Thyroid disease   ? ?Past Surgical History:  ?Procedure Laterality Date  ? ANTERIOR CERVICAL DECOMP/DISCECTOMY FUSION N/A 04/23/2019  ? Procedure: ANTERIOR CERVICAL DECOMPRESSION/DISCECTOMY FUSION 1 LEVEL C4-5;  Surgeon: Meade Maw, MD;  Location: [facility] ORS;  Service: Neurosurgery;  Laterality: N/A;  ? BACK SURGERY  1994  ? herniated disc removed lumbar area [facility])  ? [place]  ? COLONOSCOPY    ? COLONOSCOPY WITH PROPOFOL N/A 04/29/2021  ? Procedure: COLONOSCOPY WITH PROPOFOL;  Surgeon: Annamaria Helling, DO;  Location[facility] ENDOSCOPY;  Service: Gastroenterology;  Laterality: N/A;  ? TONSILLECTOMY    ? TONSILLECTOMY AND ADENOIDECTOMY    ? teenager  ? TUBAL LIGATION    ? ?Patient Active Problem List  ? Diagnosis Date Noted  ? Cervical myelopathy ([place]) 04/23/2019  ? Osteopenia of multiple sites 10/23/2018  ? Late latent syphilis 01/08/2018  ? Sinus bradycardia by electrocardiogram 11/07/2017  ? RAD  (reactive airway disease), moderate persistent, uncomplicated 46/80/3212  ? Chronic midline low back pain without sciatica 09/05/2016  ? Hot flashes, menopausal 09/05/2016  ? Mid back pain on left side 09/05/2016  ? Postmenopausal 03/30/2016  ? Gastroesophageal reflux disease without esophagitis 12/24/2014  ? Degenerative disc disease, lumbar 12/24/2014  ? Acquired hypothyroidism 09/12/2014  ? Benign essential hypertension 09/12/2014  ? Pure hypercholesterolemia 09/12/2014  ? ? ?PCP: Marinda Elk, MD ? ?REFERRING PROVIDER: Starling Manns, MD ? ?REFERRING DIAG: secondary scoliosis, lumbar region  ? ?THERAPY DIAG:  ?Other low back pain ? ?Radiculopathy, lumbar region ? ?Pain in right hip ? ?Difficulty in walking, not elsewhere classified ? ?ONSET DATE: chronic from about 1994, but several months ago got worse ? ?PERTINENT HISTORY: Patient is a 66 y.o. female who presents to outpatient physical therapy with a referral for medical diagnosis other secondary scoliosis, lumbar region. This patient's chief complaints consist of low back pain with radiation to right LE knee and medial calf leading to the following functional deficits: difficulty with prolonged positions, walking, sitting, working, hobbies, lifting, moving to a new house, going up and down stairs, etc. Relevant past medical history and comorbidities include reactive airway disease, HTN, GERD, hx of cervical myelopathy with gait disruption (ACDF 04/22/2022 at C4-5), osteopenia, pure hypercholesterolemia, hypothyroidism, unexplained dropping things and stumbling, latex sensitivity.  Patient denies hx of cancer, stroke, seizures, heart problems, diabetes, unexplained weight loss, and unexplained changes in bowel or bladder problems. She has had some unexplained dropping things. She is planniing to have major surgery in her low back (possibly at mid thoracic to sacral fusion?) in November 2023.  ? ?PRECAUTIONS: none ? ?PATIENT GOALS: "to try to reduce some  of the pain" ? ?SUBJECTIVE:  Patient reports she is feeling pretty good today but she did have a lot of left sided aching when she was walking during her work break. It got better again when she went back and sat down. She also developed a lot of R knee burning pain while standing this weekend that also subsided when she sat.  She reports no pain currently. She states she felt good after last PT session. She saw her surgeon since last PT session to go over the results of her bone density exam. She states they were pleased with the results and are planning a CT scan next to further prepare for surgery.  ? ?PAIN:  ?Are you having pain? no ?   ?OBJECTIVE ?                                                                                                                                                                                   ?TODAY'S TREATMENT  ?Therapeutic exercise: to centralize symptoms and improve ROM, strength, muscular endurance, and activity tolerance required for successful completion of functional activities.  ?NuStep level 5 using bilateral upper and lower extremities. Seat/handle setting 6/8. For improved extremity mobility, muscular endurance, and activity tolerance; and to induce the analgesic effect of aerobic exercise, stimulate improved joint nutrition, and prepare body structures and systems for following interventions. x 5  minutes. Average SPM = 69.  ?(Manual therapy - see below).  ?- prone alternating hip extension with knee extended and core braced (multifidus kicks), 3x10 each side.  ?- Squats with buttock touch on 17 inch chair while holding 3kg med ball at 90 degrees shoulder flexion, 3x10.  ?- seated marching on clear theraball, with 1-2 second holds, 3x10 each side. SBA-min A to prevent fall.  ?- seated autoelongation exercise on theraball with miiror feedback. ~ 5 min of practice.  ? ?Manual therapy: to reduce pain and tissue tension, improve range of motion, neuromodulation, in order  to promote improved ability to complete functional activities.  ?PRONE ?- STM to bilateral lumbar and lower thoracic paraspinals and quadratus lumborum (tender R > L).  ? ?Pt required multimodal cuing for proper technique and to facilitate improved neuromuscular control, strength, range of motion, and functional ability resulting in improved performance and form. ? ? ?PATIENT EDUCATION:  ?  Education details: Exercise purpose/form. Self management techniques. Education on diagnosis, prognosis, POC, anatomy and physiology of current condition  ?Person educated: Patient ?Education method: Explanation and Demonstration ?Education comprehension: verbalized understanding and needs further education ? ? ?HOME EXERCISE PROGRAM: ?Access Code: [email or web address] ?URL: [email or web address] ?Date: 11/10/2021 ?Prepared by: Rosita Kea ? ?Exercises ?- Sahrmann Level 1 - Supine 90-90 Leg Lifts and Lowers with Hip >90  - 3 x weekly - 3 sets - 10 reps ?- Prone Hip Extension with Pillow Under Abdomen  - 3 x weekly - 3 sets - 20 reps ? ?ASSESSMENT: ? ?CLINICAL IMPRESSION: ?Patient tolerated treatment well overall with no increase in pain by end of session. She continued working on improved core and functional strength and manual therapy was utilized to decrease tension in low back. Plan to continue with progressive strengthening and manual therapy as appropriate next session. Patient would benefit from continued management of limiting condition by skilled physical therapist to address remaining impairments and functional limitations to work towards stated goals and return to PLOF or maximal functional independence.  ?  ? ?Patient is a 66 y.o. female referred to outpatient physical therapy with a medical diagnosis of other secondary scoliosis, lumbar region who presents with signs and symptoms consistent with acute on chronic bilateral low back pain with intermittent radiation to right LE below the knee in the context of  levoscoliosis mainly at the lumbar region. Her referring provider has recommended major surgery later in the year for her low back.  Patient presents with significant pain, ROM, posture, joint stiffness, muscle tensio

## 2021-11-18 ENCOUNTER — Other Ambulatory Visit: Payer: Self-pay | Admitting: Orthopedic Surgery

## 2021-11-18 DIAGNOSIS — M545 Low back pain, unspecified: Secondary | ICD-10-CM

## 2021-11-21 ENCOUNTER — Ambulatory Visit: Payer: Medicare Other | Admitting: Physical Therapy

## 2021-11-21 ENCOUNTER — Encounter: Payer: Self-pay | Admitting: Physical Therapy

## 2021-11-21 DIAGNOSIS — M5459 Other low back pain: Secondary | ICD-10-CM

## 2021-11-21 DIAGNOSIS — R262 Difficulty in walking, not elsewhere classified: Secondary | ICD-10-CM

## 2021-11-21 DIAGNOSIS — M5416 Radiculopathy, lumbar region: Secondary | ICD-10-CM

## 2021-11-21 DIAGNOSIS — M25551 Pain in right hip: Secondary | ICD-10-CM

## 2021-11-24 ENCOUNTER — Encounter: Payer: Self-pay | Admitting: Physical Therapy

## 2021-11-24 ENCOUNTER — Ambulatory Visit: Payer: Medicare Other | Admitting: Physical Therapy

## 2021-11-24 DIAGNOSIS — M5416 Radiculopathy, lumbar region: Secondary | ICD-10-CM

## 2021-11-24 DIAGNOSIS — R262 Difficulty in walking, not elsewhere classified: Secondary | ICD-10-CM

## 2021-11-24 DIAGNOSIS — M25551 Pain in right hip: Secondary | ICD-10-CM

## 2021-11-24 DIAGNOSIS — M5459 Other low back pain: Secondary | ICD-10-CM | POA: Diagnosis not present

## 2021-11-24 NOTE — Therapy (Signed)
OUTPATIENT PHYSICAL THERAPY TREATMENT NOTE   Patient Name: SHANYCE DARIS MRN: [id number] DOB:09-Apr-1956, 66 y.o., female Today's Date: 11/24/2021   PT End of Session - 11/24/21 1658     Visit Number 12    Number of Visits 24    Date for PT Re-Evaluation 01/10/22    Authorization Type MEDICARE PART B reporting period from 11/17/2021    Authorization Time Period Cert 08/11/52-08/16/04    Progress Note Due on Visit 20    PT Start Time 1652    PT Stop Time 1730    PT Time Calculation (min) 38 min    Activity Tolerance Patient tolerated treatment well    Behavior During Therapy WFL for tasks assessed/performed                       Past Medical History:  Diagnosis Date   Degenerative disc disease, lumbar    Difficult intubation    GERD (gastroesophageal reflux disease)    Hypercholesterolemia    Hypertension    Hypothyroidism    Late latent syphilis    Osteopenia    Reactive airway disease, moderate persistent, uncomplicated    Sinus bradycardia    Thyroid disease    Past Surgical History:  Procedure Laterality Date   ANTERIOR CERVICAL DECOMP/DISCECTOMY FUSION N/A 04/23/2019   Procedure: ANTERIOR CERVICAL DECOMPRESSION/DISCECTOMY FUSION 1 LEVEL C4-5;  Surgeon: Meade Maw, MD;  Location: [facility];  Service: Neurosurgery;  Laterality: N/A;   BACK SURGERY  1994   herniated disc removed lumbar area [facility])   [place]   COLONOSCOPY     COLONOSCOPY WITH PROPOFOL N/A 04/29/2021   Procedure: COLONOSCOPY WITH PROPOFOL;  Surgeon: Annamaria Helling, DO;  Location: [place];  Service: Gastroenterology;  Laterality: N/A;   TONSILLECTOMY     TONSILLECTOMY AND ADENOIDECTOMY     teenager   TUBAL LIGATION     Patient Active Problem List   Diagnosis Date Noted   Cervical myelopathy ([place]) 04/23/2019   Osteopenia of multiple sites 10/23/2018   Late latent syphilis 01/08/2018   Sinus bradycardia by electrocardiogram 11/07/2017   RAD  (reactive airway disease), moderate persistent, uncomplicated 23/76/2831   Chronic midline low back pain without sciatica 09/05/2016   Hot flashes, menopausal 09/05/2016   Mid back pain on left side 09/05/2016   Postmenopausal 03/30/2016   Gastroesophageal reflux disease without esophagitis 12/24/2014   Degenerative disc disease, lumbar 12/24/2014   Acquired hypothyroidism 09/12/2014   Benign essential hypertension 09/12/2014   Pure hypercholesterolemia 09/12/2014    PCP: Marinda Elk, MD  REFERRING PROVIDER: Starling Manns, MD  REFERRING DIAG: secondary scoliosis, lumbar region   THERAPY DIAG:  Other low back pain  Radiculopathy, lumbar region  Pain in right hip  Difficulty in walking, not elsewhere classified  Rationale for Evaluation and Treatment: Rehabilitation  ONSET DATE: chronic from about 1994, but several months ago got worse  PERTINENT HISTORY: Patient is a 66 y.o. female who presents to outpatient physical therapy with a referral for medical diagnosis other secondary scoliosis, lumbar region. This patient's chief complaints consist of low back pain with radiation to right LE knee and medial calf leading to the following functional deficits: difficulty with prolonged positions, walking, sitting, working, hobbies, lifting, moving to a new house, going up and down stairs, etc. Relevant past medical history and comorbidities include reactive airway disease, HTN, GERD, hx of cervical myelopathy with gait disruption (ACDF 04/22/2022 at C4-5), osteopenia, pure hypercholesterolemia, hypothyroidism,  unexplained dropping things and stumbling, latex sensitivity.  Patient denies hx of cancer, stroke, seizures, heart problems, diabetes, unexplained weight loss, and unexplained changes in bowel or bladder problems. She has had some unexplained dropping things. She is planniing to have major surgery in her low back (possibly at mid thoracic to sacral fusion?) in November 2023.    PRECAUTIONS: none  PATIENT GOALS: "to try to reduce some of the pain"  SUBJECTIVE:  Patient reports no pain upon arrival. She did not try her afternoon walk at work because they had an ice cream party instead. She states she felt good after last PT session.   PAIN:  Are you having pain? no    OBJECTIVE                                                                                                                                                                                    TODAY'S TREATMENT  Therapeutic exercise: to centralize symptoms and improve ROM, strength, muscular endurance, and activity tolerance required for successful completion of functional activities.  - NuStep level 5 using bilateral upper and lower extremities. Seat/handle setting 6/8. For improved extremity mobility, muscular endurance, and activity tolerance; and to induce the analgesic effect of aerobic exercise, stimulate improved joint nutrition, and prepare body structures and systems for following interventions. x 5  minutes. Average SPM = 79.   (Manual therapy - see below).   - prone alternating hip extension with knee extended and core braced (multifidus kicks), 3x10 each side.  - Seated lat pull, 3x10 with 25# - seated autoelongation exercise on theraball with miiror feedback. ~ 3 min of practice.  - seated (on theraball) diaphragmatic breating with mirror and tactile feedback. Patient able to perform correctly with hands on chest and belly after extensive cuing but unable to move hands to sticks for autoelongation without losing coordination of breath.  - Education on HEP including handout   Manual therapy: to reduce pain and tissue tension, improve range of motion, neuromodulation, in order to promote improved ability to complete functional activities.  PRONE - STM to bilateral lumbar and lower thoracic paraspinals and quadratus lumborum (tender R > L near QL).   Pt required multimodal cuing for proper  technique and to facilitate improved neuromuscular control, strength, range of motion, and functional ability resulting in improved performance and form.   PATIENT EDUCATION:  Education details: Exercise purpose/form. Self management techniques. Education on diagnosis, prognosis, POC, anatomy and physiology of current condition  Person educated: Patient Education method: Customer service manager Education comprehension: verbalized understanding and needs further education   HOME EXERCISE PROGRAM: Access Code: [email or web address] URL: [email or web address] Date: 11/10/2021 Prepared by: Clarise Cruz  Glennon Kopko  Exercises - Sahrmann Level 1 - Supine 90-90 Leg Lifts and Lowers with Hip >90  - 3 x weekly - 3 sets - 10 reps - Prone Hip Extension with Pillow Under Abdomen  - 3 x weekly - 3 sets - 20 reps  ASSESSMENT:  CLINICAL IMPRESSION: Patient tolerated treatment well overall with no increase in pain by end of session. She continued working on improved core strength and motor control. She needs continued practice to master diaphragmatic breathing to prevent her trunk from collapsing every time she breathes out.  Plan to continue with similar interventions, progressed as appropriate next session. Patient would benefit from continued management of limiting condition by skilled physical therapist to address remaining impairments and functional limitations to work towards stated goals and return to PLOF or maximal functional independence.     Patient is a 66 y.o. female referred to outpatient physical therapy with a medical diagnosis of other secondary scoliosis, lumbar region who presents with signs and symptoms consistent with acute on chronic bilateral low back pain with intermittent radiation to right LE below the knee in the context of levoscoliosis mainly at the lumbar region. Her referring provider has recommended major surgery later in the year for her low back.  Patient presents with  significant pain, ROM, posture, joint stiffness, muscle tension, motor control, muscle performance (strength/power/endurance) and activity tolerance impairments that are limiting ability to complete her usual activities such as prolonged positions, walking, sitting, working, hobbies, lifting, moving to a new house, going up and down stairs, etc  without difficulty and decreases her quality of life. Patient will benefit from skilled physical therapy intervention to address current body structure impairments and activity limitations to improve function and work towards goals set in current POC in order to return to prior level of function or maximal functional improvement.   OBJECTIVE IMPAIRMENTS decreased activity tolerance, decreased balance, decreased endurance, decreased mobility, difficulty walking, decreased ROM, decreased strength, hypomobility, impaired perceived functional ability, increased muscle spasms, impaired flexibility, impaired sensation, postural dysfunction, and pain.   ACTIVITY LIMITATIONS community activity, occupation, and difficulty with prolonged positions, walking, sitting, working, hobbies, lifting, moving to a new house, going up and down stairs, etc.    PERSONAL FACTORS Age, Past/current experiences, Time since onset of injury/illness/exacerbation, and 3+ comorbidities: reactive airway disease, HTN, GERD, hx of cervical myelopathy with gait disruption (ACDF 04/22/2022 at C4-5), osteopenia, pure hypercholesterolemia, hypothyroidism, unexplained dropping things and stumbling are also affecting patient's functional outcome.    REHAB POTENTIAL: Good  CLINICAL DECISION MAKING: Evolving/moderate complexity  EVALUATION COMPLEXITY: Moderate   GOALS: Goals reviewed with patient? No  SHORT TERM GOALS: Target date: 11/01/2021  Patient will be independent with initial home exercise program for self-management of symptoms. Baseline: Initial HEP to be provided at visit 2 as  appropriate (10/18/21); Initial HEP provided visit 2 (10/20/2021);  Goal status: Achieved   LONG TERM GOALS: Target date: 01/10/2022  Patient will be independent with a long-term home exercise program for self-management of symptoms.  Baseline: Initial HEP to be provided at visit 2 as appropriate (10/18/21); initial HEP provided visit 2 (10/20/2021); currently participation appropriately (11/17/2021);  Goal status: IN PROGRESS  2.  Patient will demonstrate improved FOTO to equal or greater than 62 by visit #13 to demonstrate improvement in overall condition and self-reported functional ability.  Baseline: 50 (10/18/21); 53 at visit #5 (10/31/2021); 42 at visit #10 (11/17/2021);  Goal status: ongoing   3.  Reduce pain with functional activities to equal  or less than 1/10 to allow patient to complete usual activities including housework, walking, bending with less difficulty.   Baseline: up to 9/10 (10/18/21); up to 8/10 (11/17/2021);  Goal status: IN PROGRESS  4.  Patient will demonstrate right hip strength equal or greater than left hip strength without increase in symptoms to improve ability to perform functional activities such as navigating stairs, walking, and lifting with less difficulty.  Baseline: 1/2 MMT grade lower on R than left and symptomatic in some motions - see objective exam (10/18/21); improved strength in some motions and not painful in any motion  - see objective (11/17/2021);  Goal status: IN PROGRESS  5.  Patient will complete community, work and/or recreational activities without limitation due to current condition.  Baseline: difficulty with prolonged positions, walking, sitting, working, hobbies, lifting, moving to a new house, going up and down stairs, etc.  (10/18/21); continues to have difficulty with these activities at about the same rate (11/17/2021);  Goal status: On-going    PLAN: PT FREQUENCY: 1-2x/week  PT DURATION: 12 weeks  PLANNED INTERVENTIONS: Therapeutic  exercises, Therapeutic activity, Neuromuscular re-education, Balance training, Patient/Family education, Joint mobilization, Dry Needling, Electrical stimulation, Spinal mobilization, Cryotherapy, Moist heat, and Manual therapy.  PLAN FOR NEXT SESSION: update HEP as appropriate, progressive, core, hip and functional strengthening, manual therapy as needed   Everlean Alstrom. Graylon Good, PT, DPT 11/24/21, 7:37 PM  [facility] [address] [place], [place] [address] P: [phone] I F: [phone]

## 2021-11-28 ENCOUNTER — Ambulatory Visit
Admission: RE | Admit: 2021-11-28 | Discharge: 2021-11-28 | Disposition: A | Discharge status: Home / Self Care | Payer: Medicare Other | Source: Ambulatory Visit | Attending: Orthopedic Surgery | Admitting: Orthopedic Surgery

## 2021-11-28 DIAGNOSIS — M545 Low back pain, unspecified: Secondary | ICD-10-CM

## 2021-11-29 ENCOUNTER — Encounter: Payer: Self-pay | Admitting: Physical Therapy

## 2021-11-29 ENCOUNTER — Ambulatory Visit: Payer: Medicare Other | Admitting: Physical Therapy

## 2021-11-29 DIAGNOSIS — M5459 Other low back pain: Secondary | ICD-10-CM | POA: Diagnosis not present

## 2021-11-29 DIAGNOSIS — R262 Difficulty in walking, not elsewhere classified: Secondary | ICD-10-CM

## 2021-11-29 DIAGNOSIS — M25551 Pain in right hip: Secondary | ICD-10-CM

## 2021-11-29 DIAGNOSIS — M5416 Radiculopathy, lumbar region: Secondary | ICD-10-CM

## 2021-11-29 NOTE — Therapy (Signed)
OUTPATIENT PHYSICAL THERAPY TREATMENT NOTE   Patient Name: Kathryn Whitehead MRN: [id number] DOB:1955-07-31, 66 y.o., female Today's Date: 11/29/2021   PT End of Session - 11/29/21 1827     Visit Number 13    Number of Visits 24    Date for PT Re-Evaluation 01/10/22    Authorization Type MEDICARE PART B reporting period from 11/17/2021    Authorization Time Period Cert 10/08/00-01/09/52    Progress Note Due on Visit 84    PT Start Time 1820    PT Stop Time 1900    PT Time Calculation (min) 40 min    Activity Tolerance Patient tolerated treatment well    Behavior During Therapy WFL for tasks assessed/performed               Past Medical History:  Diagnosis Date   Degenerative disc disease, lumbar    Difficult intubation    GERD (gastroesophageal reflux disease)    Hypercholesterolemia    Hypertension    Hypothyroidism    Late latent syphilis    Osteopenia    Reactive airway disease, moderate persistent, uncomplicated    Sinus bradycardia    Thyroid disease    Past Surgical History:  Procedure Laterality Date   ANTERIOR CERVICAL DECOMP/DISCECTOMY FUSION N/A 04/23/2019   Procedure: ANTERIOR CERVICAL DECOMPRESSION/DISCECTOMY FUSION 1 LEVEL C4-5;  Surgeon: Meade Maw, MD;  Location: [facility];  Service: Neurosurgery;  Laterality: N/A;   BACK SURGERY  1994   herniated disc removed lumbar area [facility])   [place]   COLONOSCOPY     COLONOSCOPY WITH PROPOFOL N/A 04/29/2021   Procedure: COLONOSCOPY WITH PROPOFOL;  Surgeon: Annamaria Helling, DO;  Location: [place];  Service: Gastroenterology;  Laterality: N/A;   TONSILLECTOMY     TONSILLECTOMY AND ADENOIDECTOMY     teenager   TUBAL LIGATION     Patient Active Problem List   Diagnosis Date Noted   Cervical myelopathy ([place]) 04/23/2019   Osteopenia of multiple sites 10/23/2018   Late latent syphilis 01/08/2018   Sinus bradycardia by electrocardiogram 11/07/2017   RAD (reactive airway  disease), moderate persistent, uncomplicated 66/44/0347   Chronic midline low back pain without sciatica 09/05/2016   Hot flashes, menopausal 09/05/2016   Mid back pain on left side 09/05/2016   Postmenopausal 03/30/2016   Gastroesophageal reflux disease without esophagitis 12/24/2014   Degenerative disc disease, lumbar 12/24/2014   Acquired hypothyroidism 09/12/2014   Benign essential hypertension 09/12/2014   Pure hypercholesterolemia 09/12/2014    PCP: Marinda Elk, MD  REFERRING PROVIDER: Starling Manns, MD  REFERRING DIAG: secondary scoliosis, lumbar region   THERAPY DIAG:  Other low back pain  Radiculopathy, lumbar region  Pain in right hip  Difficulty in walking, not elsewhere classified  Rationale for Evaluation and Treatment: Rehabilitation  ONSET DATE: chronic from about 1994, but several months ago got worse  PERTINENT HISTORY: Patient is a 66 y.o. female who presents to outpatient physical therapy with a referral for medical diagnosis other secondary scoliosis, lumbar region. This patient's chief complaints consist of low back pain with radiation to right LE knee and medial calf leading to the following functional deficits: difficulty with prolonged positions, walking, sitting, working, hobbies, lifting, moving to a new house, going up and down stairs, etc. Relevant past medical history and comorbidities include reactive airway disease, HTN, GERD, hx of cervical myelopathy with gait disruption (ACDF 04/22/2022 at C4-5), osteopenia, pure hypercholesterolemia, hypothyroidism, unexplained dropping things and stumbling, latex sensitivity.  Patient denies hx of cancer, stroke, seizures, heart problems, diabetes, unexplained weight loss, and unexplained changes in bowel or bladder problems. She has had some unexplained dropping things. She is planniing to have major surgery in her low back (possibly at mid thoracic to sacral fusion?) in November 2023.   PRECAUTIONS:  none  PATIENT GOALS: "to try to reduce some of the pain"  SUBJECTIVE:  Patient reports she is feeling okay today with no pain currently. She had pain earlier today when she went for her afternoon walk and her back felt kind of stiff. She felt okay after last PT session.   PAIN:  Are you having pain? no    OBJECTIVE                                                                                                                                                                                    TODAY'S TREATMENT  Therapeutic exercise: to centralize symptoms and improve ROM, strength, muscular endurance, and activity tolerance required for successful completion of functional activities.  - NuStep level 5 using bilateral upper and lower extremities. Seat/handle setting 6/8. For improved extremity mobility, muscular endurance, and activity tolerance; and to induce the analgesic effect of aerobic exercise, stimulate improved joint nutrition, and prepare body structures and systems for following interventions. x 5  minutes. Average SPM = 75.  (Manual therapy - see below).  - prone alternating hip extension with knee extended and core braced (multifidus kicks), 3x10 each side.  - seated diaphragmatic breathing, patient is able to recognize and change when she is breathing backwards.  - step standing pallof press with YTB loop with one foot on flat side of large half-spike ball.  - Seated lat pull, 2x15 with 25#  Manual therapy: to reduce pain and tissue tension, improve range of motion, neuromodulation, in order to promote improved ability to complete functional activities.  PRONE - STM to bilateral lumbar and lower thoracic paraspinals and quadratus lumborum (tender R and L near QL).   Pt required multimodal cuing for proper technique and to facilitate improved neuromuscular control, strength, range of motion, and functional ability resulting in improved performance and form.   PATIENT EDUCATION:   Education details: Exercise purpose/form. Self management techniques. Education on diagnosis, prognosis, POC, anatomy and physiology of current condition  Person educated: Patient Education method: Customer service manager Education comprehension: verbalized understanding and needs further education   HOME EXERCISE PROGRAM: Access Code: [email or web address] URL: [email or web address] Date: 11/10/2021 Prepared by: Rosita Kea  Exercises - Sahrmann Level 1 - Supine 90-90 Leg Lifts and Lowers with Hip >90  - 3 x weekly - 3 sets - 10 reps - Prone Hip  Extension with Pillow Under Abdomen  - 3 x weekly - 3 sets - 20 reps  ASSESSMENT:  CLINICAL IMPRESSION: Patient tolerated treatment well overall with no increase in pain by end of session. Patient did have some right knee pain/paresthesia with onset during standing exercise but resolved with seated lumbar flexion roll out. Plan to continue with manual therapy and strengthening as appropriate .Patient would benefit from continued management of limiting condition by skilled physical therapist to address remaining impairments and functional limitations to work towards stated goals and return to PLOF or maximal functional independence.   Patient is a 66 y.o. female referred to outpatient physical therapy with a medical diagnosis of other secondary scoliosis, lumbar region who presents with signs and symptoms consistent with acute on chronic bilateral low back pain with intermittent radiation to right LE below the knee in the context of levoscoliosis mainly at the lumbar region. Her referring provider has recommended major surgery later in the year for her low back.  Patient presents with significant pain, ROM, posture, joint stiffness, muscle tension, motor control, muscle performance (strength/power/endurance) and activity tolerance impairments that are limiting ability to complete her usual activities such as prolonged positions, walking,  sitting, working, hobbies, lifting, moving to a new house, going up and down stairs, etc  without difficulty and decreases her quality of life. Patient will benefit from skilled physical therapy intervention to address current body structure impairments and activity limitations to improve function and work towards goals set in current POC in order to return to prior level of function or maximal functional improvement.   OBJECTIVE IMPAIRMENTS decreased activity tolerance, decreased balance, decreased endurance, decreased mobility, difficulty walking, decreased ROM, decreased strength, hypomobility, impaired perceived functional ability, increased muscle spasms, impaired flexibility, impaired sensation, postural dysfunction, and pain.   ACTIVITY LIMITATIONS community activity, occupation, and difficulty with prolonged positions, walking, sitting, working, hobbies, lifting, moving to a new house, going up and down stairs, etc.    PERSONAL FACTORS Age, Past/current experiences, Time since onset of injury/illness/exacerbation, and 3+ comorbidities: reactive airway disease, HTN, GERD, hx of cervical myelopathy with gait disruption (ACDF 04/22/2022 at C4-5), osteopenia, pure hypercholesterolemia, hypothyroidism, unexplained dropping things and stumbling are also affecting patient's functional outcome.    REHAB POTENTIAL: Good  CLINICAL DECISION MAKING: Evolving/moderate complexity  EVALUATION COMPLEXITY: Moderate   GOALS: Goals reviewed with patient? No  SHORT TERM GOALS: Target date: 11/01/2021  Patient will be independent with initial home exercise program for self-management of symptoms. Baseline: Initial HEP to be provided at visit 2 as appropriate (10/18/21); Initial HEP provided visit 2 (10/20/2021);  Goal status: Achieved   LONG TERM GOALS: Target date: 01/10/2022  Patient will be independent with a long-term home exercise program for self-management of symptoms.  Baseline: Initial HEP to be  provided at visit 2 as appropriate (10/18/21); initial HEP provided visit 2 (10/20/2021); currently participation appropriately (11/17/2021);  Goal status: IN PROGRESS  2.  Patient will demonstrate improved FOTO to equal or greater than 62 by visit #13 to demonstrate improvement in overall condition and self-reported functional ability.  Baseline: 50 (10/18/21); 53 at visit #5 (10/31/2021); 42 at visit #10 (11/17/2021);  Goal status: ongoing   3.  Reduce pain with functional activities to equal or less than 1/10 to allow patient to complete usual activities including housework, walking, bending with less difficulty.   Baseline: up to 9/10 (10/18/21); up to 8/10 (11/17/2021);  Goal status: IN PROGRESS  4.  Patient will demonstrate right hip strength equal  or greater than left hip strength without increase in symptoms to improve ability to perform functional activities such as navigating stairs, walking, and lifting with less difficulty.  Baseline: 1/2 MMT grade lower on R than left and symptomatic in some motions - see objective exam (10/18/21); improved strength in some motions and not painful in any motion  - see objective (11/17/2021);  Goal status: IN PROGRESS  5.  Patient will complete community, work and/or recreational activities without limitation due to current condition.  Baseline: difficulty with prolonged positions, walking, sitting, working, hobbies, lifting, moving to a new house, going up and down stairs, etc.  (10/18/21); continues to have difficulty with these activities at about the same rate (11/17/2021);  Goal status: On-going    PLAN: PT FREQUENCY: 1-2x/week  PT DURATION: 12 weeks  PLANNED INTERVENTIONS: Therapeutic exercises, Therapeutic activity, Neuromuscular re-education, Balance training, Patient/Family education, Joint mobilization, Dry Needling, Electrical stimulation, Spinal mobilization, Cryotherapy, Moist heat, and Manual therapy.  PLAN FOR NEXT SESSION: update HEP  as appropriate, progressive, core, hip and functional strengthening, manual therapy as needed   Everlean Alstrom. Graylon Good, PT, DPT 11/29/21, 7:20 PM  [facility] [address] [place], [place] [address] P: [phone] I F: [phone]

## 2021-12-01 NOTE — Telephone Encounter (Signed)
Opened by mistake.

## 2021-12-07 ENCOUNTER — Encounter: Payer: Self-pay | Admitting: Physical Therapy

## 2021-12-07 ENCOUNTER — Ambulatory Visit: Payer: Medicare Other | Admitting: Physical Therapy

## 2021-12-07 DIAGNOSIS — R262 Difficulty in walking, not elsewhere classified: Secondary | ICD-10-CM

## 2021-12-07 DIAGNOSIS — M5459 Other low back pain: Secondary | ICD-10-CM | POA: Diagnosis not present

## 2021-12-07 DIAGNOSIS — M5416 Radiculopathy, lumbar region: Secondary | ICD-10-CM

## 2021-12-07 DIAGNOSIS — M25551 Pain in right hip: Secondary | ICD-10-CM

## 2021-12-07 NOTE — Therapy (Signed)
OUTPATIENT PHYSICAL THERAPY TREATMENT NOTE   Patient Name: Kathryn Whitehead MRN: [id number] DOB:Feb 12, 1956, 66 y.o., female Today's Date: 12/07/2021   PT End of Session - 12/07/21 1821     Visit Number 14    Number of Visits 24    Date for PT Re-Evaluation 01/10/22    Authorization Type MEDICARE PART B reporting period from 11/17/2021    Authorization Time Period Cert 2/84/13-08/13/38    Progress Note Due on Visit 66    PT Start Time 1820    PT Stop Time 1900    PT Time Calculation (min) 40 min    Activity Tolerance Patient tolerated treatment well    Behavior During Therapy WFL for tasks assessed/performed                Past Medical History:  Diagnosis Date   Degenerative disc disease, lumbar    Difficult intubation    GERD (gastroesophageal reflux disease)    Hypercholesterolemia    Hypertension    Hypothyroidism    Late latent syphilis    Osteopenia    Reactive airway disease, moderate persistent, uncomplicated    Sinus bradycardia    Thyroid disease    Past Surgical History:  Procedure Laterality Date   ANTERIOR CERVICAL DECOMP/DISCECTOMY FUSION N/A 04/23/2019   Procedure: ANTERIOR CERVICAL DECOMPRESSION/DISCECTOMY FUSION 1 LEVEL C4-5;  Surgeon: Meade Maw, MD;  Location: [facility];  Service: Neurosurgery;  Laterality: N/A;   BACK SURGERY  1994   herniated disc removed lumbar area [facility])   [place]   COLONOSCOPY     COLONOSCOPY WITH PROPOFOL N/A 04/29/2021   Procedure: COLONOSCOPY WITH PROPOFOL;  Surgeon: Annamaria Helling, DO;  Location: [place];  Service: Gastroenterology;  Laterality: N/A;   TONSILLECTOMY     TONSILLECTOMY AND ADENOIDECTOMY     teenager   TUBAL LIGATION     Patient Active Problem List   Diagnosis Date Noted   Cervical myelopathy ([place]) 04/23/2019   Osteopenia of multiple sites 10/23/2018   Late latent syphilis 01/08/2018   Sinus bradycardia by electrocardiogram 11/07/2017   RAD (reactive airway  disease), moderate persistent, uncomplicated 05/06/2535   Chronic midline low back pain without sciatica 09/05/2016   Hot flashes, menopausal 09/05/2016   Mid back pain on left side 09/05/2016   Postmenopausal 03/30/2016   Gastroesophageal reflux disease without esophagitis 12/24/2014   Degenerative disc disease, lumbar 12/24/2014   Acquired hypothyroidism 09/12/2014   Benign essential hypertension 09/12/2014   Pure hypercholesterolemia 09/12/2014    PCP: Marinda Elk, MD  REFERRING PROVIDER: Starling Manns, MD  REFERRING DIAG: secondary scoliosis, lumbar region   THERAPY DIAG:  Other low back pain  Radiculopathy, lumbar region  Pain in right hip  Difficulty in walking, not elsewhere classified  Rationale for Evaluation and Treatment: Rehabilitation  ONSET DATE: chronic from about 1994, but several months ago got worse  PERTINENT HISTORY: Patient is a 66 y.o. female who presents to outpatient physical therapy with a referral for medical diagnosis other secondary scoliosis, lumbar region. This patient's chief complaints consist of low back pain with radiation to right LE knee and medial calf leading to the following functional deficits: difficulty with prolonged positions, walking, sitting, working, hobbies, lifting, moving to a new house, going up and down stairs, etc. Relevant past medical history and comorbidities include reactive airway disease, HTN, GERD, hx of cervical myelopathy with gait disruption (ACDF 04/22/2022 at C4-5), osteopenia, pure hypercholesterolemia, hypothyroidism, unexplained dropping things and stumbling, latex sensitivity.  Patient denies hx of cancer, stroke, seizures, heart problems, diabetes, unexplained weight loss, and unexplained changes in bowel or bladder problems. She has had some unexplained dropping things. She is planniing to have major surgery in her low back (possibly at mid thoracic to sacral fusion?) in November 2023.   PRECAUTIONS:  none  PATIENT GOALS: "to try to reduce some of the pain"  SUBJECTIVE:  Patient reports she is feeling good today. She states she had some muscle pain over her left clavicle that hurt for a couple of days after last PT session. She has an appointment with [facility] this Friday. She has no pain currently. Her HEP is going pretty well.   PAIN:  Are you having pain? no    OBJECTIVE                                                                                                                                                                                    TODAY'S TREATMENT  Therapeutic exercise: to centralize symptoms and improve ROM, strength, muscular endurance, and activity tolerance required for successful completion of functional activities.  - NuStep level 5 using bilateral upper and lower extremities. Seat/handle setting 6/8. For improved extremity mobility, muscular endurance, and activity tolerance; and to induce the analgesic effect of aerobic exercise, stimulate improved joint nutrition, and prepare body structures and systems for following interventions. x 5  minutes. Average SPM = 76.  (Manual therapy - see below).  - prone alternating hip extension with knee extended and core braced (multifidus kicks), 3x10 each side. (Added 2# AW for last set to progress exercise).  - hooklying diaphragmatic breathing, hands on belly, good technique, 2 min at self-selected pace.  - seated on theraball with arms holding PVC pipes in position for auto-elongation exercise:  diaphragmatic breathing, hands on belly, good technique, 2 min at self-selected pace progressing to longer breaths with cuing.  - seated auto-elongation on theraball with hands holding PVC sticks focusing on activating lats during exhale during diaphragmatic breathing. SBA for safey. No mirror. 2 min at self-selected pace. - step standing pallof press with YTB loop with one foot on flat side of large half-spike  ball. 2x10 each side with each foot front. SBA.   Manual therapy: to reduce pain and tissue tension, improve range of motion, neuromodulation, in order to promote improved ability to complete functional activities.  PRONE - STM to bilateral lumbar and lower thoracic paraspinals and quadratus lumborum.   Pt required multimodal cuing for proper technique and to facilitate improved neuromuscular control, strength, range of motion, and functional ability resulting in improved performance and form.   PATIENT EDUCATION:  Education details:  Exercise purpose/form. Self management techniques. Education on diagnosis, prognosis, POC, anatomy and physiology of current condition  Person educated: Patient Education method: Customer service manager Education comprehension: verbalized understanding and needs further education   HOME EXERCISE PROGRAM: Access Code: [email or web address] URL: [email or web address] Date: 11/10/2021 Prepared by: Rosita Kea  Exercises - Sahrmann Level 1 - Supine 90-90 Leg Lifts and Lowers with Hip >90  - 3 x weekly - 3 sets - 10 reps - Prone Hip Extension with Pillow Under Abdomen  - 3 x weekly - 3 sets - 20 reps  ASSESSMENT:  CLINICAL IMPRESSION: Patient tolerated treatment well overall and reported feeling better by end of session. Patient demonstrates improving breathing technique and is better able to support spine with core while breathing. Plan to continue progression to auto-elongation exercise next session. Patient would benefit from continued management of limiting condition by skilled physical therapist to address remaining impairments and functional limitations to work towards stated goals and return to PLOF or maximal functional independence.   Patient is a 66 y.o. female referred to outpatient physical therapy with a medical diagnosis of other secondary scoliosis, lumbar region who presents with signs and symptoms consistent with acute on chronic  bilateral low back pain with intermittent radiation to right LE below the knee in the context of levoscoliosis mainly at the lumbar region. Her referring provider has recommended major surgery later in the year for her low back.  Patient presents with significant pain, ROM, posture, joint stiffness, muscle tension, motor control, muscle performance (strength/power/endurance) and activity tolerance impairments that are limiting ability to complete her usual activities such as prolonged positions, walking, sitting, working, hobbies, lifting, moving to a new house, going up and down stairs, etc  without difficulty and decreases her quality of life. Patient will benefit from skilled physical therapy intervention to address current body structure impairments and activity limitations to improve function and work towards goals set in current POC in order to return to prior level of function or maximal functional improvement.   OBJECTIVE IMPAIRMENTS decreased activity tolerance, decreased balance, decreased endurance, decreased mobility, difficulty walking, decreased ROM, decreased strength, hypomobility, impaired perceived functional ability, increased muscle spasms, impaired flexibility, impaired sensation, postural dysfunction, and pain.   ACTIVITY LIMITATIONS community activity, occupation, and difficulty with prolonged positions, walking, sitting, working, hobbies, lifting, moving to a new house, going up and down stairs, etc.    PERSONAL FACTORS Age, Past/current experiences, Time since onset of injury/illness/exacerbation, and 3+ comorbidities: reactive airway disease, HTN, GERD, hx of cervical myelopathy with gait disruption (ACDF 04/22/2022 at C4-5), osteopenia, pure hypercholesterolemia, hypothyroidism, unexplained dropping things and stumbling are also affecting patient's functional outcome.    REHAB POTENTIAL: Good  CLINICAL DECISION MAKING: Evolving/moderate complexity  EVALUATION COMPLEXITY:  Moderate   GOALS: Goals reviewed with patient? No  SHORT TERM GOALS: Target date: 11/01/2021  Patient will be independent with initial home exercise program for self-management of symptoms. Baseline: Initial HEP to be provided at visit 2 as appropriate (10/18/21); Initial HEP provided visit 2 (10/20/2021);  Goal status: Achieved   LONG TERM GOALS: Target date: 01/10/2022  Patient will be independent with a long-term home exercise program for self-management of symptoms.  Baseline: Initial HEP to be provided at visit 2 as appropriate (10/18/21); initial HEP provided visit 2 (10/20/2021); currently participation appropriately (11/17/2021);  Goal status: IN PROGRESS  2.  Patient will demonstrate improved FOTO to equal or greater than 62 by visit #13 to demonstrate improvement in overall condition and self-reported functional ability.  Baseline: 50 (10/18/21); 53 at visit #5 (10/31/2021); 42 at visit #10 (11/17/2021);  Goal status: ongoing   3.  Reduce pain with functional activities to equal or less than 1/10 to allow patient to complete usual activities including housework, walking, bending with less difficulty.   Baseline: up to 9/10 (10/18/21); up to 8/10 (11/17/2021);  Goal status: IN PROGRESS  4.  Patient will demonstrate right hip strength equal or greater than left hip strength without increase in symptoms to improve ability to perform functional activities such as navigating stairs, walking, and lifting with less difficulty.  Baseline: 1/2 MMT grade lower on R than left and symptomatic in some motions - see objective exam (10/18/21); improved strength in some motions and not painful in any motion  - see objective (11/17/2021);  Goal status: IN PROGRESS  5.  Patient will complete community, work and/or recreational activities without limitation due to current condition.  Baseline: difficulty with prolonged positions, walking, sitting, working, hobbies, lifting, moving to a new house, going  up and down stairs, etc.  (10/18/21); continues to have difficulty with these activities at about the same rate (11/17/2021);  Goal status: On-going    PLAN: PT FREQUENCY: 1-2x/week  PT DURATION: 12 weeks  PLANNED INTERVENTIONS: Therapeutic exercises, Therapeutic activity, Neuromuscular re-education, Balance training, Patient/Family education, Joint mobilization, Dry Needling, Electrical stimulation, Spinal mobilization, Cryotherapy, Moist heat, and Manual therapy.  PLAN FOR NEXT SESSION: update HEP as appropriate, progressive, core, hip and functional strengthening, manual therapy as needed   Everlean Alstrom. Graylon Good, PT, DPT 12/07/21, 7:34 PM  [facility] [address] [place], [place] [address] P: [phone] I F: [phone]

## 2021-12-12 ENCOUNTER — Other Ambulatory Visit: Payer: Self-pay | Admitting: Orthopedic Surgery

## 2021-12-12 DIAGNOSIS — M48062 Spinal stenosis, lumbar region with neurogenic claudication: Secondary | ICD-10-CM

## 2021-12-12 DIAGNOSIS — M4316 Spondylolisthesis, lumbar region: Secondary | ICD-10-CM

## 2021-12-13 ENCOUNTER — Encounter: Payer: Self-pay | Admitting: Physical Therapy

## 2021-12-13 ENCOUNTER — Ambulatory Visit: Payer: Medicare Other | Attending: Orthopaedic Surgery | Admitting: Physical Therapy

## 2021-12-13 DIAGNOSIS — M5459 Other low back pain: Secondary | ICD-10-CM | POA: Insufficient documentation

## 2021-12-13 DIAGNOSIS — M5416 Radiculopathy, lumbar region: Secondary | ICD-10-CM | POA: Diagnosis present

## 2021-12-13 DIAGNOSIS — M25551 Pain in right hip: Secondary | ICD-10-CM | POA: Diagnosis present

## 2021-12-13 DIAGNOSIS — R262 Difficulty in walking, not elsewhere classified: Secondary | ICD-10-CM | POA: Insufficient documentation

## 2021-12-13 NOTE — Therapy (Signed)
OUTPATIENT PHYSICAL THERAPY TREATMENT NOTE   Patient Name: Kathryn Whitehead MRN: [id number] DOB:02/10/1956, 66 y.o., female Today's Date: 12/13/2021   PT End of Session - 12/13/21 1919     Visit Number 15    Number of Visits 24    Date for PT Re-Evaluation 01/10/22    Authorization Type MEDICARE PART B reporting period from 11/17/2021    Authorization Time Period Cert 0/53/97-12/14/32    Progress Note Due on Visit 67    PT Start Time 1822    PT Stop Time 1900    PT Time Calculation (min) 38 min    Activity Tolerance Patient tolerated treatment well    Behavior During Therapy WFL for tasks assessed/performed                 Past Medical History:  Diagnosis Date   Degenerative disc disease, lumbar    Difficult intubation    GERD (gastroesophageal reflux disease)    Hypercholesterolemia    Hypertension    Hypothyroidism    Late latent syphilis    Osteopenia    Reactive airway disease, moderate persistent, uncomplicated    Sinus bradycardia    Thyroid disease    Past Surgical History:  Procedure Laterality Date   ANTERIOR CERVICAL DECOMP/DISCECTOMY FUSION N/A 04/23/2019   Procedure: ANTERIOR CERVICAL DECOMPRESSION/DISCECTOMY FUSION 1 LEVEL C4-5;  Surgeon: Meade Maw, MD;  Location: [facility];  Service: Neurosurgery;  Laterality: N/A;   BACK SURGERY  1994   herniated disc removed lumbar area [facility])   [place]   COLONOSCOPY     COLONOSCOPY WITH PROPOFOL N/A 04/29/2021   Procedure: COLONOSCOPY WITH PROPOFOL;  Surgeon: Annamaria Helling, DO;  Location: [place];  Service: Gastroenterology;  Laterality: N/A;   TONSILLECTOMY     TONSILLECTOMY AND ADENOIDECTOMY     teenager   TUBAL LIGATION     Patient Active Problem List   Diagnosis Date Noted   Cervical myelopathy ([place]) 04/23/2019   Osteopenia of multiple sites 10/23/2018   Late latent syphilis 01/08/2018   Sinus bradycardia by electrocardiogram 11/07/2017   RAD (reactive airway  disease), moderate persistent, uncomplicated 19/37/9024   Chronic midline low back pain without sciatica 09/05/2016   Hot flashes, menopausal 09/05/2016   Mid back pain on left side 09/05/2016   Postmenopausal 03/30/2016   Gastroesophageal reflux disease without esophagitis 12/24/2014   Degenerative disc disease, lumbar 12/24/2014   Acquired hypothyroidism 09/12/2014   Benign essential hypertension 09/12/2014   Pure hypercholesterolemia 09/12/2014    PCP: Marinda Elk, MD  REFERRING PROVIDER: Starling Manns, MD  REFERRING DIAG: secondary scoliosis, lumbar region   THERAPY DIAG:  Other low back pain  Radiculopathy, lumbar region  Pain in right hip  Difficulty in walking, not elsewhere classified  Rationale for Evaluation and Treatment: Rehabilitation  ONSET DATE: chronic from about 1994, but several months ago got worse  PERTINENT HISTORY: Patient is a 66 y.o. female who presents to outpatient physical therapy with a referral for medical diagnosis other secondary scoliosis, lumbar region. This patient's chief complaints consist of low back pain with radiation to right LE knee and medial calf leading to the following functional deficits: difficulty with prolonged positions, walking, sitting, working, hobbies, lifting, moving to a new house, going up and down stairs, etc. Relevant past medical history and comorbidities include reactive airway disease, HTN, GERD, hx of cervical myelopathy with gait disruption (ACDF 04/22/2022 at C4-5), osteopenia, pure hypercholesterolemia, hypothyroidism, unexplained dropping things and stumbling, latex  sensitivity.  Patient denies hx of cancer, stroke, seizures, heart problems, diabetes, unexplained weight loss, and unexplained changes in bowel or bladder problems. She has had some unexplained dropping things. She is planniing to have major surgery in her low back (possibly at mid thoracic to sacral fusion?) in November 2023.   PRECAUTIONS:  none  PATIENT GOALS: "to try to reduce some of the pain"  SUBJECTIVE:  Patient reports she had a good weekend but had some pain today while sitting at work. She got her treadmill to her house yesterday and she was able to walk on it some. She is usually stopped form walking on the treadmill due to back or right knee numbness from the back. She had her follow up about her CT results and they looked good so she is scheduled to have an MRI soon with expectation to follow up in July about those results.   PAIN:  Are you having pain? Yes, 3/10 across lower back.     OBJECTIVE                                                                                                                                                                                    TODAY'S TREATMENT  Therapeutic exercise: to centralize symptoms and improve ROM, strength, muscular endurance, and activity tolerance required for successful completion of functional activities.  - NuStep level 5 using bilateral upper and lower extremities. Seat/handle setting 6/8. For improved extremity mobility, muscular endurance, and activity tolerance; and to induce the analgesic effect of aerobic exercise, stimulate improved joint nutrition, and prepare body structures and systems for following interventions. x 5  minutes. Average SPM = 73.  - seated auto-elongation on theraball with hands holding PVC sticks focusing on activating lats during exhale during diaphragmatic breathing. SBA for safey. No mirror. 2 min at self-selected pace. - seated auto-elongation on theraball with hands holding PVC sticks focusing on activating lats during exhale during diaphragmatic breathing as well as following PT's tactile cuing to improve posture and muscle activation throughout torso. 2 min.  - step standing pallof press with YTB loop with one foot on flat side of large half-spike ball. 2x10 each side with each foot front. SBA.  - standing shoulder extension from ~  120 degrees to neutral using bar on cable anchored overhead while activating abdominal muscles, 2x10. Needed seated rest due to back aching.  - leaning alternating hip extension with hands on high plinth, 1x10 each side.  - modified bird dog from toes to hands leaning on high plinth, 1x10 with feet closer, then 3x10 with feet as far back as needed to find appropriate challenge.   Pt required multimodal  cuing for proper technique and to facilitate improved neuromuscular control, strength, range of motion, and functional ability resulting in improved performance and form.   PATIENT EDUCATION:  Education details: Exercise purpose/form. Self management techniques. Education on diagnosis, prognosis, POC, anatomy and physiology of current condition  Person educated: Patient Education method: Customer service manager Education comprehension: verbalized understanding and needs further education   HOME EXERCISE PROGRAM: Access Code: [email or web address] URL: [email or web address] Date: 11/10/2021 Prepared by: Rosita Kea  Exercises - Sahrmann Level 1 - Supine 90-90 Leg Lifts and Lowers with Hip >90  - 3 x weekly - 3 sets - 10 reps - Prone Hip Extension with Pillow Under Abdomen  - 3 x weekly - 3 sets - 20 reps  ASSESSMENT:  CLINICAL IMPRESSION: Patient tolerated treatment well and reported feeling better by end of session. Did not include manual therapy today to see if patient's pain remains stable and continues to respond to exercise without manual therapy. Plan to continue progressive core, LE, and functional exercises as tolerated and manual therapy as appropriate next session. Patient would benefit from continued management of limiting condition by skilled physical therapist to address remaining impairments and functional limitations to work towards stated goals and return to PLOF or maximal functional independence.    Patient is a 66 y.o. female referred to outpatient physical  therapy with a medical diagnosis of other secondary scoliosis, lumbar region who presents with signs and symptoms consistent with acute on chronic bilateral low back pain with intermittent radiation to right LE below the knee in the context of levoscoliosis mainly at the lumbar region. Her referring provider has recommended major surgery later in the year for her low back.  Patient presents with significant pain, ROM, posture, joint stiffness, muscle tension, motor control, muscle performance (strength/power/endurance) and activity tolerance impairments that are limiting ability to complete her usual activities such as prolonged positions, walking, sitting, working, hobbies, lifting, moving to a new house, going up and down stairs, etc  without difficulty and decreases her quality of life. Patient will benefit from skilled physical therapy intervention to address current body structure impairments and activity limitations to improve function and work towards goals set in current POC in order to return to prior level of function or maximal functional improvement.   OBJECTIVE IMPAIRMENTS decreased activity tolerance, decreased balance, decreased endurance, decreased mobility, difficulty walking, decreased ROM, decreased strength, hypomobility, impaired perceived functional ability, increased muscle spasms, impaired flexibility, impaired sensation, postural dysfunction, and pain.   ACTIVITY LIMITATIONS community activity, occupation, and difficulty with prolonged positions, walking, sitting, working, hobbies, lifting, moving to a new house, going up and down stairs, etc.    PERSONAL FACTORS Age, Past/current experiences, Time since onset of injury/illness/exacerbation, and 3+ comorbidities: reactive airway disease, HTN, GERD, hx of cervical myelopathy with gait disruption (ACDF 04/22/2022 at C4-5), osteopenia, pure hypercholesterolemia, hypothyroidism, unexplained dropping things and stumbling are also affecting  patient's functional outcome.    REHAB POTENTIAL: Good  CLINICAL DECISION MAKING: Evolving/moderate complexity  EVALUATION COMPLEXITY: Moderate   GOALS: Goals reviewed with patient? No  SHORT TERM GOALS: Target date: 11/01/2021  Patient will be independent with initial home exercise program for self-management of symptoms. Baseline: Initial HEP to be provided at visit 2 as appropriate (10/18/21); Initial HEP provided visit 2 (10/20/2021);  Goal status: Achieved   LONG TERM GOALS: Target date: 01/10/2022  Patient will be independent with a long-term home exercise program for self-management of symptoms.  Baseline: Initial HEP to be provided at visit 2  as appropriate (10/18/21); initial HEP provided visit 2 (10/20/2021); currently participation appropriately (11/17/2021);  Goal status: IN PROGRESS  2.  Patient will demonstrate improved FOTO to equal or greater than 62 by visit #13 to demonstrate improvement in overall condition and self-reported functional ability.  Baseline: 50 (10/18/21); 53 at visit #5 (10/31/2021); 42 at visit #10 (11/17/2021);  Goal status: ongoing   3.  Reduce pain with functional activities to equal or less than 1/10 to allow patient to complete usual activities including housework, walking, bending with less difficulty.   Baseline: up to 9/10 (10/18/21); up to 8/10 (11/17/2021);  Goal status: IN PROGRESS  4.  Patient will demonstrate right hip strength equal or greater than left hip strength without increase in symptoms to improve ability to perform functional activities such as navigating stairs, walking, and lifting with less difficulty.  Baseline: 1/2 MMT grade lower on R than left and symptomatic in some motions - see objective exam (10/18/21); improved strength in some motions and not painful in any motion  - see objective (11/17/2021);  Goal status: IN PROGRESS  5.  Patient will complete community, work and/or recreational activities without limitation due to  current condition.  Baseline: difficulty with prolonged positions, walking, sitting, working, hobbies, lifting, moving to a new house, going up and down stairs, etc.  (10/18/21); continues to have difficulty with these activities at about the same rate (11/17/2021);  Goal status: On-going    PLAN: PT FREQUENCY: 1-2x/week  PT DURATION: 12 weeks  PLANNED INTERVENTIONS: Therapeutic exercises, Therapeutic activity, Neuromuscular re-education, Balance training, Patient/Family education, Joint mobilization, Dry Needling, Electrical stimulation, Spinal mobilization, Cryotherapy, Moist heat, and Manual therapy.  PLAN FOR NEXT SESSION: update HEP as appropriate, progressive, core, hip and functional strengthening, manual therapy as needed   Everlean Alstrom. Graylon Good, PT, DPT 12/13/21, 7:20 PM  [facility] [address] [place], [place] [address] P: [phone] I F: [phone]

## 2021-12-14 NOTE — Therapy (Addendum)
OUTPATIENT PHYSICAL THERAPY TREATMENT NOTE   Patient Name: LOUNETTE SLOAN MRN: [id number] DOB:1956/01/10, 66 y.o., female Today's Date: 12/15/2021   PT End of Session - 12/15/21 1847     Visit Number 16    Number of Visits 24    Date for PT Re-Evaluation 01/10/22    Authorization Type MEDICARE PART B reporting period from 11/17/2021    Authorization Time Period Cert 3/84/66-11/16/91    Progress Note Due on Visit 70    PT Start Time 1815    PT Stop Time 1855    PT Time Calculation (min) 40 min    Activity Tolerance Patient tolerated treatment well    Behavior During Therapy WFL for tasks assessed/performed                  Past Medical History:  Diagnosis Date   Degenerative disc disease, lumbar    Difficult intubation    GERD (gastroesophageal reflux disease)    Hypercholesterolemia    Hypertension    Hypothyroidism    Late latent syphilis    Osteopenia    Reactive airway disease, moderate persistent, uncomplicated    Sinus bradycardia    Thyroid disease    Past Surgical History:  Procedure Laterality Date   ANTERIOR CERVICAL DECOMP/DISCECTOMY FUSION N/A 04/23/2019   Procedure: ANTERIOR CERVICAL DECOMPRESSION/DISCECTOMY FUSION 1 LEVEL C4-5;  Surgeon: Meade Maw, MD;  Location: [facility];  Service: Neurosurgery;  Laterality: N/A;   BACK SURGERY  1994   herniated disc removed lumbar area [facility])   [place]   COLONOSCOPY     COLONOSCOPY WITH PROPOFOL N/A 04/29/2021   Procedure: COLONOSCOPY WITH PROPOFOL;  Surgeon: Annamaria Helling, DO;  Location: [place];  Service: Gastroenterology;  Laterality: N/A;   TONSILLECTOMY     TONSILLECTOMY AND ADENOIDECTOMY     teenager   TUBAL LIGATION     Patient Active Problem List   Diagnosis Date Noted   Cervical myelopathy ([place]) 04/23/2019   Osteopenia of multiple sites 10/23/2018   Late latent syphilis 01/08/2018   Sinus bradycardia by electrocardiogram 11/07/2017   RAD (reactive  airway disease), moderate persistent, uncomplicated 57/07/7791   Chronic midline low back pain without sciatica 09/05/2016   Hot flashes, menopausal 09/05/2016   Mid back pain on left side 09/05/2016   Postmenopausal 03/30/2016   Gastroesophageal reflux disease without esophagitis 12/24/2014   Degenerative disc disease, lumbar 12/24/2014   Acquired hypothyroidism 09/12/2014   Benign essential hypertension 09/12/2014   Pure hypercholesterolemia 09/12/2014    PCP: Marinda Elk, MD  REFERRING PROVIDER: Starling Manns, MD  REFERRING DIAG: secondary scoliosis, lumbar region   THERAPY DIAG:  Other low back pain  Radiculopathy, lumbar region  Pain in right hip  Difficulty in walking, not elsewhere classified  Rationale for Evaluation and Treatment: Rehabilitation  ONSET DATE: chronic from about 1994, but several months ago got worse  PERTINENT HISTORY: Patient is a 66 y.o. female who presents to outpatient physical therapy with a referral for medical diagnosis other secondary scoliosis, lumbar region. This patient's chief complaints consist of low back pain with radiation to right LE knee and medial calf leading to the following functional deficits: difficulty with prolonged positions, walking, sitting, working, hobbies, lifting, moving to a new house, going up and down stairs, etc. Relevant past medical history and comorbidities include reactive airway disease, HTN, GERD, hx of cervical myelopathy with gait disruption (ACDF 04/22/2022 at C4-5), osteopenia, pure hypercholesterolemia, hypothyroidism, unexplained dropping things and stumbling,  latex sensitivity.  Patient denies hx of cancer, stroke, seizures, heart problems, diabetes, unexplained weight loss, and unexplained changes in bowel or bladder problems. She has had some unexplained dropping things. She is planniing to have major surgery in her low back (possibly at mid thoracic to sacral fusion?) in November 2023.    PRECAUTIONS: none  PATIENT GOALS: "to try to reduce some of the pain"  SUBJECTIVE:  Patient reports she is feeling well today. She states her back has been a little achy today but has no pain upon arrival. She states she felt okay after last PT session.    PAIN:  Are you having pain? no    OBJECTIVE                                                                                                                                                                                    TODAY'S TREATMENT  Therapeutic exercise: to centralize symptoms and improve ROM, strength, muscular endurance, and activity tolerance required for successful completion of functional activities.  - NuStep level 6 using bilateral upper and lower extremities. Seat/handle setting 6/8. For improved extremity mobility, muscular endurance, and activity tolerance; and to induce the analgesic effect of aerobic exercise, stimulate improved joint nutrition, and prepare body structures and systems for following interventions. x 5  minutes. Average SPM = 80.  - seated auto-elongation on theraball with hands holding PVC sticks focusing on activating lats during exhale during diaphragmatic breathing. SBA for safey. No mirror. 2 min at self-selected pace. - seated auto-elongation on theraball with hands holding PVC sticks focusing on activating lats during exhale during diaphragmatic breathing as well as following PT's tactile cuing to improve posture and muscle activation throughout torso. 2 min. - step standing pallof press with YTB loop with one foot on flat side of large half-spike ball. 2x15 each side with each foot front. Supervision.   - Seated lat pull, 3x10 with 30/25/25# - modified bird dog from toes to hands leaning on high plinth, 3x10 with feet as far back as needed to find appropriate challenge.  (seated rest due to a bit of left sided low back pain after bird dog, noted for R knee numbness following seated rest) -  standing glute pull through, 1x10 with 5#  (Increased low back discomfort and R knee paresthesia).  - seated lumbar flexion roll out with clear theraball, 5 second holds, 1x2 min.   Pt required multimodal cuing for proper technique and to facilitate improved neuromuscular control, strength, range of motion, and functional ability resulting in improved performance and form.   PATIENT EDUCATION:  Education details: Exercise purpose/form. Self management techniques. Education on diagnosis, prognosis, POC,  anatomy and physiology of current condition  Person educated: Patient Education method: Explanation and Demonstration Education comprehension: verbalized understanding and needs further education   HOME EXERCISE PROGRAM: Access Code: [email or web address] URL: [email or web address] Date: 11/10/2021 Prepared by: Rosita Kea  Exercises - [place]ann Level 1 - Supine 90-90 Leg Lifts and Lowers with Hip >90  - 3 x weekly - 3 sets - 10 reps - Prone Hip Extension with Pillow Under Abdomen  - 3 x weekly - 3 sets - 20 reps  ASSESSMENT:  CLINICAL IMPRESSION: Patient tolerated treatment well with no increase in symptoms until third set of modified bird dogs when she reported increased left sided low back pain by end of 3rd set. She then reported paresthesia to the right knee and great toe after seated rest. She was able to complete very light glute pull through but continued to have right knee paresthesia and low back discomfort. Seated lumbar flexion abolished leg paresthesia and decreased low back discomfort by end of session. Plan to continue with core, functional, and LE strengthening exercises next session as appropriate. Patient would benefit from continued management of limiting condition by skilled physical therapist to address remaining impairments and functional limitations to work towards stated goals and return to PLOF or maximal functional independence.   Patient is a 66 y.o. female  referred to outpatient physical therapy with a medical diagnosis of other secondary scoliosis, lumbar region who presents with signs and symptoms consistent with acute on chronic bilateral low back pain with intermittent radiation to right LE below the knee in the context of levoscoliosis mainly at the lumbar region. Her referring provider has recommended major surgery later in the year for her low back.  Patient presents with significant pain, ROM, posture, joint stiffness, muscle tension, motor control, muscle performance (strength/power/endurance) and activity tolerance impairments that are limiting ability to complete her usual activities such as prolonged positions, walking, sitting, working, hobbies, lifting, moving to a new house, going up and down stairs, etc  without difficulty and decreases her quality of life. Patient will benefit from skilled physical therapy intervention to address current body structure impairments and activity limitations to improve function and work towards goals set in current POC in order to return to prior level of function or maximal functional improvement.   OBJECTIVE IMPAIRMENTS decreased activity tolerance, decreased balance, decreased endurance, decreased mobility, difficulty walking, decreased ROM, decreased strength, hypomobility, impaired perceived functional ability, increased muscle spasms, impaired flexibility, impaired sensation, postural dysfunction, and pain.   ACTIVITY LIMITATIONS community activity, occupation, and difficulty with prolonged positions, walking, sitting, working, hobbies, lifting, moving to a new house, going up and down stairs, etc.    PERSONAL FACTORS Age, Past/current experiences, Time since onset of injury/illness/exacerbation, and 3+ comorbidities: reactive airway disease, HTN, GERD, hx of cervical myelopathy with gait disruption (ACDF 04/22/2022 at C4-5), osteopenia, pure hypercholesterolemia, hypothyroidism, unexplained dropping things  and stumbling are also affecting patient's functional outcome.    REHAB POTENTIAL: Good  CLINICAL DECISION MAKING: Evolving/moderate complexity  EVALUATION COMPLEXITY: Moderate   GOALS: Goals reviewed with patient? No  SHORT TERM GOALS: Target date: 11/01/2021  Patient will be independent with initial home exercise program for self-management of symptoms. Baseline: Initial HEP to be provided at visit 2 as appropriate (10/18/21); Initial HEP provided visit 2 (10/20/2021);  Goal status: Achieved   LONG TERM GOALS: Target date: 01/10/2022  Patient will be independent with a long-term home exercise program for self-management of symptoms.  Baseline: Initial HEP to be provided at visit  2 as appropriate (10/18/21); initial HEP provided visit 2 (10/20/2021); currently participation appropriately (11/17/2021);  Goal status: IN PROGRESS  2.  Patient will demonstrate improved FOTO to equal or greater than 62 by visit #13 to demonstrate improvement in overall condition and self-reported functional ability.  Baseline: 50 (10/18/21); 53 at visit #5 (10/31/2021); 42 at visit #10 (11/17/2021);  Goal status: ongoing   3.  Reduce pain with functional activities to equal or less than 1/10 to allow patient to complete usual activities including housework, walking, bending with less difficulty.   Baseline: up to 9/10 (10/18/21); up to 8/10 (11/17/2021);  Goal status: IN PROGRESS  4.  Patient will demonstrate right hip strength equal or greater than left hip strength without increase in symptoms to improve ability to perform functional activities such as navigating stairs, walking, and lifting with less difficulty.  Baseline: 1/2 MMT grade lower on R than left and symptomatic in some motions - see objective exam (10/18/21); improved strength in some motions and not painful in any motion  - see objective (11/17/2021);  Goal status: IN PROGRESS  5.  Patient will complete community, work and/or recreational  activities without limitation due to current condition.  Baseline: difficulty with prolonged positions, walking, sitting, working, hobbies, lifting, moving to a new house, going up and down stairs, etc.  (10/18/21); continues to have difficulty with these activities at about the same rate (11/17/2021);  Goal status: On-going    PLAN: PT FREQUENCY: 1-2x/week  PT DURATION: 12 weeks  PLANNED INTERVENTIONS: Therapeutic exercises, Therapeutic activity, Neuromuscular re-education, Balance training, Patient/Family education, Joint mobilization, Dry Needling, Electrical stimulation, Spinal mobilization, Cryotherapy, Moist heat, and Manual therapy.  PLAN FOR NEXT SESSION: update HEP as appropriate, progressive, core, hip and functional strengthening, manual therapy as needed   Everlean Alstrom. Graylon Good, PT, DPT 12/15/21, 7:09 PM  [facility] [address] [place], [place] [address] P: [phone] I F: [phone]

## 2021-12-15 ENCOUNTER — Encounter: Payer: Self-pay | Admitting: Physical Therapy

## 2021-12-15 ENCOUNTER — Ambulatory Visit: Payer: Medicare Other | Admitting: Physical Therapy

## 2021-12-15 DIAGNOSIS — M5459 Other low back pain: Secondary | ICD-10-CM | POA: Diagnosis not present

## 2021-12-15 DIAGNOSIS — R262 Difficulty in walking, not elsewhere classified: Secondary | ICD-10-CM

## 2021-12-15 DIAGNOSIS — M5416 Radiculopathy, lumbar region: Secondary | ICD-10-CM

## 2021-12-15 DIAGNOSIS — M25551 Pain in right hip: Secondary | ICD-10-CM

## 2021-12-20 ENCOUNTER — Encounter: Payer: Self-pay | Admitting: Physical Therapy

## 2021-12-20 ENCOUNTER — Ambulatory Visit: Payer: Medicare Other | Admitting: Physical Therapy

## 2021-12-20 DIAGNOSIS — M5416 Radiculopathy, lumbar region: Secondary | ICD-10-CM

## 2021-12-20 DIAGNOSIS — M5459 Other low back pain: Secondary | ICD-10-CM

## 2021-12-20 DIAGNOSIS — R262 Difficulty in walking, not elsewhere classified: Secondary | ICD-10-CM

## 2021-12-20 DIAGNOSIS — M25551 Pain in right hip: Secondary | ICD-10-CM

## 2021-12-20 NOTE — Therapy (Signed)
OUTPATIENT PHYSICAL THERAPY TREATMENT NOTE   Patient Name: Kathryn Whitehead MRN: [id number] DOB:July 28, 1955, 66 y.o., female Today's Date: 12/20/2021   PT End of Session - 12/20/21 1652     Visit Number 17    Number of Visits 24    Date for PT Re-Evaluation 01/10/22    Authorization Type MEDICARE PART B reporting period from 11/17/2021    Authorization Time Period Cert 2/37/62-02/09/14    Progress Note Due on Visit 20    PT Start Time 1650    PT Stop Time 1728    PT Time Calculation (min) 38 min    Activity Tolerance Patient tolerated treatment well    Behavior During Therapy WFL for tasks assessed/performed                   Past Medical History:  Diagnosis Date   Degenerative disc disease, lumbar    Difficult intubation    GERD (gastroesophageal reflux disease)    Hypercholesterolemia    Hypertension    Hypothyroidism    Late latent syphilis    Osteopenia    Reactive airway disease, moderate persistent, uncomplicated    Sinus bradycardia    Thyroid disease    Past Surgical History:  Procedure Laterality Date   ANTERIOR CERVICAL DECOMP/DISCECTOMY FUSION N/A 04/23/2019   Procedure: ANTERIOR CERVICAL DECOMPRESSION/DISCECTOMY FUSION 1 LEVEL C4-5;  Surgeon: Meade Maw, MD;  Location: [facility];  Service: Neurosurgery;  Laterality: N/A;   BACK SURGERY  1994   herniated disc removed lumbar area [facility])   [facility]   COLONOSCOPY     COLONOSCOPY WITH PROPOFOL N/A 04/29/2021   Procedure: COLONOSCOPY WITH PROPOFOL;  Surgeon: Annamaria Helling, DO;  Location: [place];  Service: Gastroenterology;  Laterality: N/A;   TONSILLECTOMY     TONSILLECTOMY AND ADENOIDECTOMY     teenager   TUBAL LIGATION     Patient Active Problem List   Diagnosis Date Noted   Cervical myelopathy ([place]) 04/23/2019   Osteopenia of multiple sites 10/23/2018   Late latent syphilis 01/08/2018   Sinus bradycardia by electrocardiogram 11/07/2017   RAD (reactive  airway disease), moderate persistent, uncomplicated 17/61/6073   Chronic midline low back pain without sciatica 09/05/2016   Hot flashes, menopausal 09/05/2016   Mid back pain on left side 09/05/2016   Postmenopausal 03/30/2016   Gastroesophageal reflux disease without esophagitis 12/24/2014   Degenerative disc disease, lumbar 12/24/2014   Acquired hypothyroidism 09/12/2014   Benign essential hypertension 09/12/2014   Pure hypercholesterolemia 09/12/2014    PCP: Marinda Elk, MD  REFERRING PROVIDER: Starling Manns, MD  REFERRING DIAG: secondary scoliosis, lumbar region   THERAPY DIAG:  Other low back pain  Radiculopathy, lumbar region  Pain in right hip  Difficulty in walking, not elsewhere classified  Rationale for Evaluation and Treatment: Rehabilitation  ONSET DATE: chronic from about 1994, but several months ago got worse  PERTINENT HISTORY: Patient is a 66 y.o. female who presents to outpatient physical therapy with a referral for medical diagnosis other secondary scoliosis, lumbar region. This patient's chief complaints consist of low back pain with radiation to right LE knee and medial calf leading to the following functional deficits: difficulty with prolonged positions, walking, sitting, working, hobbies, lifting, moving to a new house, going up and down stairs, etc. Relevant past medical history and comorbidities include reactive airway disease, HTN, GERD, hx of cervical myelopathy with gait disruption (ACDF 04/22/2022 at C4-5), osteopenia, pure hypercholesterolemia, hypothyroidism, unexplained dropping things and  stumbling, latex sensitivity.  Patient denies hx of cancer, stroke, seizures, heart problems, diabetes, unexplained weight loss, and unexplained changes in bowel or bladder problems. She has had some unexplained dropping things. She is planniing to have major surgery in her low back (possibly at mid thoracic to sacral fusion?) in November 2023.    PRECAUTIONS: none  PATIENT GOALS: "to try to reduce some of the pain"  SUBJECTIVE:  Patient reports she is having more pain than usual on and off today and had to cut her afternoon walk short because the pain shot up to about 5/10. She rates her current pain at 1/10 and achy currently even while sitting down. Yesterday she worked and went out to eat as usual. Her arms were a bit sore after last PT session. She did her HEP Saturday and Sunday night. She walked on the treadmill at home a total of 15 min but had to get off and sit about 3-4 times because her back was hurting and R knee was getting numb and achy. She did try with the incline.   PAIN:  Are you having pain? Yes 1/10 in low back.     OBJECTIVE                                                                                                                                                                                    TODAY'S TREATMENT  Therapeutic exercise: to centralize symptoms and improve ROM, strength, muscular endurance, and activity tolerance required for successful completion of functional activities.  - NuStep level 6 using bilateral upper and lower extremities. Seat/handle setting 6/8. For improved extremity mobility, muscular endurance, and activity tolerance; and to induce the analgesic effect of aerobic exercise, stimulate improved joint nutrition, and prepare body structures and systems for following interventions. x 5  minutes. Average SPM = 70.  - seated auto-elongation on theraball with hands holding PVC sticks focusing on activating lats during exhale during diaphragmatic breathing. SBA for safey. No mirror. 3 min at self-selected pace.  Circuit: - modified bird dog from toes to hands leaning on high plinth, 3x10 with feet as far back as needed to find appropriate challenge.  - Seated lat pull, 3x10 with 25#  - standing glute pull through, 1x10 with 5# (increased low back pain).  - seated lumbar flexion roll out  with clear theraball, 5 second holds, 1x2 min. - step standing pallof press with RTB loop with one foot on flat side of large half-spike ball. 2x15 each side with each foot front. Supervision.    Pt required multimodal cuing for proper technique and to facilitate improved neuromuscular control, strength, range of motion, and functional ability  resulting in improved performance and form.   PATIENT EDUCATION:  Education details: Exercise purpose/form. Self management techniques. Education on diagnosis, prognosis, POC, anatomy and physiology of current condition  Person educated: Patient Education method: Customer service manager Education comprehension: verbalized understanding and needs further education   HOME EXERCISE PROGRAM: Access Code: [email or web address] URL: [email or web address] Date: 11/10/2021 Prepared by: Rosita Kea  Exercises - [place]ann Level 1 - Supine 90-90 Leg Lifts and Lowers with Hip >90  - 3 x weekly - 3 sets - 10 reps - Prone Hip Extension with Pillow Under Abdomen  - 3 x weekly - 3 sets - 20 reps  ASSESSMENT:  CLINICAL IMPRESSION: Patient tolerated treatment well overall with no increase in pain by end of session. She was able to advance to red theraband for pallof press exercise but continued to have pain with glute pull through. Plan to continue with progressive core, LE, and functional strengthening at next session. Patient would benefit from continued management of limiting condition by skilled physical therapist to address remaining impairments and functional limitations to work towards stated goals and return to PLOF or maximal functional independence.   Patient is a 66 y.o. female referred to outpatient physical therapy with a medical diagnosis of other secondary scoliosis, lumbar region who presents with signs and symptoms consistent with acute on chronic bilateral low back pain with intermittent radiation to right LE below the knee in the context of  levoscoliosis mainly at the lumbar region. Her referring provider has recommended major surgery later in the year for her low back.  Patient presents with significant pain, ROM, posture, joint stiffness, muscle tension, motor control, muscle performance (strength/power/endurance) and activity tolerance impairments that are limiting ability to complete her usual activities such as prolonged positions, walking, sitting, working, hobbies, lifting, moving to a new house, going up and down stairs, etc  without difficulty and decreases her quality of life. Patient will benefit from skilled physical therapy intervention to address current body structure impairments and activity limitations to improve function and work towards goals set in current POC in order to return to prior level of function or maximal functional improvement.   OBJECTIVE IMPAIRMENTS decreased activity tolerance, decreased balance, decreased endurance, decreased mobility, difficulty walking, decreased ROM, decreased strength, hypomobility, impaired perceived functional ability, increased muscle spasms, impaired flexibility, impaired sensation, postural dysfunction, and pain.   ACTIVITY LIMITATIONS community activity, occupation, and difficulty with prolonged positions, walking, sitting, working, hobbies, lifting, moving to a new house, going up and down stairs, etc.    PERSONAL FACTORS Age, Past/current experiences, Time since onset of injury/illness/exacerbation, and 3+ comorbidities: reactive airway disease, HTN, GERD, hx of cervical myelopathy with gait disruption (ACDF 04/22/2022 at C4-5), osteopenia, pure hypercholesterolemia, hypothyroidism, unexplained dropping things and stumbling are also affecting patient's functional outcome.    REHAB POTENTIAL: Good  CLINICAL DECISION MAKING: Evolving/moderate complexity  EVALUATION COMPLEXITY: Moderate   GOALS: Goals reviewed with patient? No  SHORT TERM GOALS: Target date:  11/01/2021  Patient will be independent with initial home exercise program for self-management of symptoms. Baseline: Initial HEP to be provided at visit 2 as appropriate (10/18/21); Initial HEP provided visit 2 (10/20/2021);  Goal status: Achieved   LONG TERM GOALS: Target date: 01/10/2022  Patient will be independent with a long-term home exercise program for self-management of symptoms.  Baseline: Initial HEP to be provided at visit 2 as appropriate (10/18/21); initial HEP provided visit 2 (10/20/2021); currently participation appropriately (11/17/2021);  Goal status: IN PROGRESS  2.  Patient  will demonstrate improved FOTO to equal or greater than 62 by visit #13 to demonstrate improvement in overall condition and self-reported functional ability.  Baseline: 50 (10/18/21); 53 at visit #5 (10/31/2021); 42 at visit #10 (11/17/2021);  Goal status: ongoing   3.  Reduce pain with functional activities to equal or less than 1/10 to allow patient to complete usual activities including housework, walking, bending with less difficulty.   Baseline: up to 9/10 (10/18/21); up to 8/10 (11/17/2021);  Goal status: IN PROGRESS  4.  Patient will demonstrate right hip strength equal or greater than left hip strength without increase in symptoms to improve ability to perform functional activities such as navigating stairs, walking, and lifting with less difficulty.  Baseline: 1/2 MMT grade lower on R than left and symptomatic in some motions - see objective exam (10/18/21); improved strength in some motions and not painful in any motion  - see objective (11/17/2021);  Goal status: IN PROGRESS  5.  Patient will complete community, work and/or recreational activities without limitation due to current condition.  Baseline: difficulty with prolonged positions, walking, sitting, working, hobbies, lifting, moving to a new house, going up and down stairs, etc.  (10/18/21); continues to have difficulty with these activities  at about the same rate (11/17/2021);  Goal status: On-going    PLAN: PT FREQUENCY: 1-2x/week  PT DURATION: 12 weeks  PLANNED INTERVENTIONS: Therapeutic exercises, Therapeutic activity, Neuromuscular re-education, Balance training, Patient/Family education, Joint mobilization, Dry Needling, Electrical stimulation, Spinal mobilization, Cryotherapy, Moist heat, and Manual therapy.  PLAN FOR NEXT SESSION: update HEP as appropriate, progressive, core, hip and functional strengthening, manual therapy as needed   Everlean Alstrom. Graylon Good, PT, DPT 12/20/21, 5:28 PM  [facility] [address] [place], [place] [address] P: [phone] I F: [phone]

## 2021-12-22 ENCOUNTER — Encounter: Payer: Self-pay | Admitting: Physical Therapy

## 2021-12-22 ENCOUNTER — Ambulatory Visit: Payer: Medicare Other | Admitting: Physical Therapy

## 2021-12-22 DIAGNOSIS — M5459 Other low back pain: Secondary | ICD-10-CM | POA: Diagnosis not present

## 2021-12-22 DIAGNOSIS — M5416 Radiculopathy, lumbar region: Secondary | ICD-10-CM

## 2021-12-22 DIAGNOSIS — M25551 Pain in right hip: Secondary | ICD-10-CM

## 2021-12-22 DIAGNOSIS — R262 Difficulty in walking, not elsewhere classified: Secondary | ICD-10-CM

## 2021-12-22 NOTE — Therapy (Signed)
OUTPATIENT PHYSICAL THERAPY TREATMENT NOTE   Patient Name: Kathryn Whitehead MRN: [id number] DOB:1955/07/24, 66 y.o., female Today's Date: 12/22/2021   PT End of Session - 12/22/21 1829     Visit Number 18    Number of Visits 24    Date for PT Re-Evaluation 01/10/22    Authorization Type MEDICARE PART B reporting period from 11/17/2021    Authorization Time Period Cert 5/63/87-11/12/41    Progress Note Due on Visit 45    PT Start Time 1822    PT Stop Time 1900    PT Time Calculation (min) 38 min    Activity Tolerance Patient tolerated treatment well    Behavior During Therapy WFL for tasks assessed/performed                    Past Medical History:  Diagnosis Date   Degenerative disc disease, lumbar    Difficult intubation    GERD (gastroesophageal reflux disease)    Hypercholesterolemia    Hypertension    Hypothyroidism    Late latent syphilis    Osteopenia    Reactive airway disease, moderate persistent, uncomplicated    Sinus bradycardia    Thyroid disease    Past Surgical History:  Procedure Laterality Date   ANTERIOR CERVICAL DECOMP/DISCECTOMY FUSION N/A 04/23/2019   Procedure: ANTERIOR CERVICAL DECOMPRESSION/DISCECTOMY FUSION 1 LEVEL C4-5;  Surgeon: Meade Maw, MD;  Location: [facility];  Service: Neurosurgery;  Laterality: N/A;   BACK SURGERY  1994   herniated disc removed lumbar area [facility])   [place]   COLONOSCOPY     COLONOSCOPY WITH PROPOFOL N/A 04/29/2021   Procedure: COLONOSCOPY WITH PROPOFOL;  Surgeon: Annamaria Helling, DO;  Location: [facility];  Service: Gastroenterology;  Laterality: N/A;   TONSILLECTOMY     TONSILLECTOMY AND ADENOIDECTOMY     teenager   TUBAL LIGATION     Patient Active Problem List   Diagnosis Date Noted   Cervical myelopathy ([place]) 04/23/2019   Osteopenia of multiple sites 10/23/2018   Late latent syphilis 01/08/2018   Sinus bradycardia by electrocardiogram 11/07/2017   RAD (reactive  airway disease), moderate persistent, uncomplicated 32/95/1884   Chronic midline low back pain without sciatica 09/05/2016   Hot flashes, menopausal 09/05/2016   Mid back pain on left side 09/05/2016   Postmenopausal 03/30/2016   Gastroesophageal reflux disease without esophagitis 12/24/2014   Degenerative disc disease, lumbar 12/24/2014   Acquired hypothyroidism 09/12/2014   Benign essential hypertension 09/12/2014   Pure hypercholesterolemia 09/12/2014    PCP: Marinda Elk, MD  REFERRING PROVIDER: Starling Manns, MD  REFERRING DIAG: secondary scoliosis, lumbar region   THERAPY DIAG:  Other low back pain  Radiculopathy, lumbar region  Pain in right hip  Difficulty in walking, not elsewhere classified  Rationale for Evaluation and Treatment: Rehabilitation  ONSET DATE: chronic from about 1994, but several months ago got worse  PERTINENT HISTORY: Patient is a 66 y.o. female who presents to outpatient physical therapy with a referral for medical diagnosis other secondary scoliosis, lumbar region. This patient's chief complaints consist of low back pain with radiation to right LE knee and medial calf leading to the following functional deficits: difficulty with prolonged positions, walking, sitting, working, hobbies, lifting, moving to a new house, going up and down stairs, etc. Relevant past medical history and comorbidities include reactive airway disease, HTN, GERD, hx of cervical myelopathy with gait disruption (ACDF 04/22/2022 at C4-5), osteopenia, pure hypercholesterolemia, hypothyroidism, unexplained dropping things  and stumbling, latex sensitivity.  Patient denies hx of cancer, stroke, seizures, heart problems, diabetes, unexplained weight loss, and unexplained changes in bowel or bladder problems. She has had some unexplained dropping things. She is planniing to have major surgery in her low back (possibly at mid thoracic to sacral fusion?) in November 2023.    PRECAUTIONS: none  PATIENT GOALS: "to try to reduce some of the pain"  SUBJECTIVE:  Patient reports she has continued to have pain on and off. She was limited in her afternoon walk today due to achiness from the midback down. She states she felt okay after last PT session.   PAIN:  Are you having pain? Yes, 1/10 middle low back    OBJECTIVE                                                                                                                                                                                    TODAY'S TREATMENT  Therapeutic exercise: to centralize symptoms and improve ROM, strength, muscular endurance, and activity tolerance required for successful completion of functional activities.  - NuStep level 6 using bilateral upper and lower extremities. Seat/handle setting 6/8. For improved extremity mobility, muscular endurance, and activity tolerance; and to induce the analgesic effect of aerobic exercise, stimulate improved joint nutrition, and prepare body structures and systems for following interventions. x 5  minutes. Average SPM = 70.  - seated auto-elongation on theraball with hands on shoulders focusing on activating lats during exhale during diaphragmatic breathing. SBA for safey. No mirror. 3 min at self-selected pace.  Circuit: - Seated lat pull, 3x15 with 25# - modified bird dog from toes to hands leaning on high plinth, 3x10 with feet as far back as needed to find appropriate challenge.   Circuit: - standing TRX row, 3x10 - inclined "Y" leaning prone on TOTAL GYM set at level 26 with 2# DB, 3x10  - standing modified side plank on high plinth, elbow to feet while rowing with YTB, 3x10 on each side.     Pt required multimodal cuing for proper technique and to facilitate improved neuromuscular control, strength, range of motion, and functional ability resulting in improved performance and form.   PATIENT EDUCATION:  Education details: Exercise  purpose/form. Self management techniques. Education on diagnosis, prognosis, POC, anatomy and physiology of current condition  Person educated: Patient Education method: Customer service manager Education comprehension: verbalized understanding and needs further education   HOME EXERCISE PROGRAM: Access Code: [email or web address] URL: [email or web address] Date: 11/10/2021 Prepared by: Rosita Kea  Exercises - Sah[place]ann Level 1 - Supine 90-90 Leg Lifts and Lowers with Hip >90  - 3 x weekly - 3 sets - 10  reps - Prone Hip Extension with Pillow Under Abdomen  - 3 x weekly - 3 sets - 20 reps  ASSESSMENT:  CLINICAL IMPRESSION: Patient tolerated treatment well overall with no increase in symptoms. Continued working on improving core, LE, and functional strength. Patient continues to have improved exercise tolerance this session. She did have some cramping in the right foot towards the beginning of the session, but this resolved. Patient would benefit from continued management of limiting condition by skilled physical therapist to address remaining impairments and functional limitations to work towards stated goals and return to PLOF or maximal functional independence.   Patient is a 66 y.o. female referred to outpatient physical therapy with a medical diagnosis of other secondary scoliosis, lumbar region who presents with signs and symptoms consistent with acute on chronic bilateral low back pain with intermittent radiation to right LE below the knee in the context of levoscoliosis mainly at the lumbar region. Her referring provider has recommended major surgery later in the year for her low back.  Patient presents with significant pain, ROM, posture, joint stiffness, muscle tension, motor control, muscle performance (strength/power/endurance) and activity tolerance impairments that are limiting ability to complete her usual activities such as prolonged positions, walking, sitting, working,  hobbies, lifting, moving to a new house, going up and down stairs, etc  without difficulty and decreases her quality of life. Patient will benefit from skilled physical therapy intervention to address current body structure impairments and activity limitations to improve function and work towards goals set in current POC in order to return to prior level of function or maximal functional improvement.   OBJECTIVE IMPAIRMENTS decreased activity tolerance, decreased balance, decreased endurance, decreased mobility, difficulty walking, decreased ROM, decreased strength, hypomobility, impaired perceived functional ability, increased muscle spasms, impaired flexibility, impaired sensation, postural dysfunction, and pain.   ACTIVITY LIMITATIONS community activity, occupation, and difficulty with prolonged positions, walking, sitting, working, hobbies, lifting, moving to a new house, going up and down stairs, etc.    PERSONAL FACTORS Age, Past/current experiences, Time since onset of injury/illness/exacerbation, and 3+ comorbidities: reactive airway disease, HTN, GERD, hx of cervical myelopathy with gait disruption (ACDF 04/22/2022 at C4-5), osteopenia, pure hypercholesterolemia, hypothyroidism, unexplained dropping things and stumbling are also affecting patient's functional outcome.    REHAB POTENTIAL: Good  CLINICAL DECISION MAKING: Evolving/moderate complexity  EVALUATION COMPLEXITY: Moderate   GOALS: Goals reviewed with patient? No  SHORT TERM GOALS: Target date: 11/01/2021  Patient will be independent with initial home exercise program for self-management of symptoms. Baseline: Initial HEP to be provided at visit 2 as appropriate (10/18/21); Initial HEP provided visit 2 (10/20/2021);  Goal status: Achieved   LONG TERM GOALS: Target date: 01/10/2022  Patient will be independent with a long-term home exercise program for self-management of symptoms.  Baseline: Initial HEP to be provided at visit  2 as appropriate (10/18/21); initial HEP provided visit 2 (10/20/2021); currently participation appropriately (11/17/2021);  Goal status: IN PROGRESS  2.  Patient will demonstrate improved FOTO to equal or greater than 62 by visit #13 to demonstrate improvement in overall condition and self-reported functional ability.  Baseline: 50 (10/18/21); 53 at visit #5 (10/31/2021); 42 at visit #10 (11/17/2021);  Goal status: ongoing   3.  Reduce pain with functional activities to equal or less than 1/10 to allow patient to complete usual activities including housework, walking, bending with less difficulty.   Baseline: up to 9/10 (10/18/21); up to 8/10 (11/17/2021);  Goal status: IN PROGRESS  4.  Patient  will demonstrate right hip strength equal or greater than left hip strength without increase in symptoms to improve ability to perform functional activities such as navigating stairs, walking, and lifting with less difficulty.  Baseline: 1/2 MMT grade lower on R than left and symptomatic in some motions - see objective exam (10/18/21); improved strength in some motions and not painful in any motion  - see objective (11/17/2021);  Goal status: IN PROGRESS  5.  Patient will complete community, work and/or recreational activities without limitation due to current condition.  Baseline: difficulty with prolonged positions, walking, sitting, working, hobbies, lifting, moving to a new house, going up and down stairs, etc.  (10/18/21); continues to have difficulty with these activities at about the same rate (11/17/2021);  Goal status: On-going    PLAN: PT FREQUENCY: 1-2x/week  PT DURATION: 12 weeks  PLANNED INTERVENTIONS: Therapeutic exercises, Therapeutic activity, Neuromuscular re-education, Balance training, Patient/Family education, Joint mobilization, Dry Needling, Electrical stimulation, Spinal mobilization, Cryotherapy, Moist heat, and Manual therapy.  PLAN FOR NEXT SESSION: update HEP as appropriate,  progressive, core, hip and functional strengthening, manual therapy as needed   Everlean Alstrom. Graylon Good, PT, DPT 12/22/21, 7:14 PM  [facility] [address] [place], [place] [address] P: [phone] I F: [phone]

## 2021-12-23 ENCOUNTER — Ambulatory Visit
Admission: RE | Admit: 2021-12-23 | Discharge: 2021-12-23 | Disposition: A | Discharge status: Home / Self Care | Payer: Medicare Other | Source: Ambulatory Visit | Attending: Orthopedic Surgery | Admitting: Orthopedic Surgery

## 2021-12-23 DIAGNOSIS — M48062 Spinal stenosis, lumbar region with neurogenic claudication: Secondary | ICD-10-CM

## 2021-12-23 DIAGNOSIS — M4316 Spondylolisthesis, lumbar region: Secondary | ICD-10-CM

## 2021-12-27 ENCOUNTER — Ambulatory Visit: Payer: Medicare Other | Admitting: Physical Therapy

## 2021-12-27 ENCOUNTER — Encounter: Payer: Self-pay | Admitting: Physical Therapy

## 2021-12-27 DIAGNOSIS — M5459 Other low back pain: Secondary | ICD-10-CM | POA: Diagnosis not present

## 2021-12-27 DIAGNOSIS — M25551 Pain in right hip: Secondary | ICD-10-CM

## 2021-12-27 DIAGNOSIS — M5416 Radiculopathy, lumbar region: Secondary | ICD-10-CM

## 2021-12-27 DIAGNOSIS — R262 Difficulty in walking, not elsewhere classified: Secondary | ICD-10-CM

## 2021-12-27 NOTE — Therapy (Signed)
OUTPATIENT PHYSICAL THERAPY TREATMENT NOTE   Patient Name: Kathryn Whitehead MRN: [id number] DOB:05-04-1956, 66 y.o., female Today's Date: 12/27/2021   PT End of Session - 12/27/21 1740     Visit Number 19    Number of Visits 24    Date for PT Re-Evaluation 01/10/22    Authorization Type MEDICARE PART B reporting period from 11/17/2021    Authorization Time Period Cert 4/31/54-0/0/86    Progress Note Due on Visit 34    PT Start Time 1739    PT Stop Time 1817    PT Time Calculation (min) 38 min    Activity Tolerance Patient tolerated treatment well    Behavior During Therapy WFL for tasks assessed/performed                     Past Medical History:  Diagnosis Date   Degenerative disc disease, lumbar    Difficult intubation    GERD (gastroesophageal reflux disease)    Hypercholesterolemia    Hypertension    Hypothyroidism    Late latent syphilis    Osteopenia    Reactive airway disease, moderate persistent, uncomplicated    Sinus bradycardia    Thyroid disease    Past Surgical History:  Procedure Laterality Date   ANTERIOR CERVICAL DECOMP/DISCECTOMY FUSION N/A 04/23/2019   Procedure: ANTERIOR CERVICAL DECOMPRESSION/DISCECTOMY FUSION 1 LEVEL C4-5;  Surgeon: Meade Maw, MD;  Location: [facility];  Service: Neurosurgery;  Laterality: N/A;   BACK SURGERY  1994   herniated disc removed lumbar area [facility])   [place]   COLONOSCOPY     COLONOSCOPY WITH PROPOFOL N/A 04/29/2021   Procedure: COLONOSCOPY WITH PROPOFOL;  Surgeon: Annamaria Helling, DO;  Location: [place];  Service: Gastroenterology;  Laterality: N/A;   TONSILLECTOMY     TONSILLECTOMY AND ADENOIDECTOMY     teenager   TUBAL LIGATION     Patient Active Problem List   Diagnosis Date Noted   Cervical myelopathy ([place]) 04/23/2019   Osteopenia of multiple sites 10/23/2018   Late latent syphilis 01/08/2018   Sinus bradycardia by electrocardiogram 11/07/2017   RAD  (reactive airway disease), moderate persistent, uncomplicated 76/19/5093   Chronic midline low back pain without sciatica 09/05/2016   Hot flashes, menopausal 09/05/2016   Mid back pain on left side 09/05/2016   Postmenopausal 03/30/2016   Gastroesophageal reflux disease without esophagitis 12/24/2014   Degenerative disc disease, lumbar 12/24/2014   Acquired hypothyroidism 09/12/2014   Benign essential hypertension 09/12/2014   Pure hypercholesterolemia 09/12/2014    PCP: Marinda Elk, MD  REFERRING PROVIDER: Marinda Elk, MD  REFERRING DIAG: secondary scoliosis, lumbar region   THERAPY DIAG:  Other low back pain  Radiculopathy, lumbar region  Pain in right hip  Difficulty in walking, not elsewhere classified  Rationale for Evaluation and Treatment: Rehabilitation  ONSET DATE: chronic from about 1994, but several months ago got worse  PERTINENT HISTORY: Patient is a 66 y.o. female who presents to outpatient physical therapy with a referral for medical diagnosis other secondary scoliosis, lumbar region. This patient's chief complaints consist of low back pain with radiation to right LE knee and medial calf leading to the following functional deficits: difficulty with prolonged positions, walking, sitting, working, hobbies, lifting, moving to a new house, going up and down stairs, etc. Relevant past medical history and comorbidities include reactive airway disease, HTN, GERD, hx of cervical myelopathy with gait disruption (ACDF 04/22/2022 at C4-5), osteopenia, pure hypercholesterolemia, hypothyroidism, unexplained dropping  things and stumbling, latex sensitivity.  Patient denies hx of cancer, stroke, seizures, heart problems, diabetes, unexplained weight loss, and unexplained changes in bowel or bladder problems. She has had some unexplained dropping things. She is planniing to have major surgery in her low back (possibly at mid thoracic to sacral fusion?) in November  2023.   PRECAUTIONS: none  PATIENT GOALS: "to try to reduce some of the pain"  SUBJECTIVE:  Patient states her back is feeling pretty good today. Her right wrist was hurting at work this morning so she took some Meloxicam and tylenol which helped her wrist and made her back feel good today. She thinks the wrist pain came from working. She felt okay following last PT session. She walked on the treadmill 8 min today without a problem. [place] have problems from back pain. [place]. She avoided going out on the beach to walk (even though she wanted to) because last time she did that it put her in a lot of pain.   PAIN:  Are you having pain? no    OBJECTIVE                                                                                                                                                                                    TODAY'S TREATMENT  Therapeutic exercise: to centralize symptoms and improve ROM, strength, muscular endurance, and activity tolerance required for successful completion of functional activities.  - NuStep level 6 using bilateral upper and lower extremities. Seat/handle setting 6/8. For improved extremity mobility, muscular endurance, and activity tolerance; and to induce the analgesic effect of aerobic exercise, stimulate improved joint nutrition, and prepare body structures and systems for following interventions. x 5  minutes. Average SPM = 73. - seated auto-elongation on theraball with hands on shoulders focusing on activating lats during exhale during diaphragmatic breathing. SBA for safey. No mirror. 3 min at self-selected pace.  Circuit: - Seated lat pull, 3x10 with 30# (35# too hard) - modified bird dog with abdomen resting on green theraball, touching mat with toes and hands, 3x10 with feet as far back as needed to find appropriate challenge.   - standing modified  side plank on high plinth, elbow to feet while rowing with RTB, 3x10 on each side.  - hooklying abdominal brace with bicycling legs, 3x30 seconds. - Education on HEP including handout     Pt required multimodal cuing for proper technique and to facilitate improved neuromuscular control, strength, range of motion, and functional ability resulting in improved performance and form.   PATIENT EDUCATION:  Education details: Exercise purpose/form. Self management techniques. Education on diagnosis, prognosis, POC, anatomy and physiology of current condition  Person educated: Patient Education method: Customer service manager Education comprehension: verbalized understanding and needs further education   HOME EXERCISE PROGRAM: Access Code: [email or web address] URL: [email or web address] Date: 12/27/2021 Prepared by: Rosita Kea  Exercises - Beginner Flat Bicycle  - 3 x weekly - 3 sets - 30 seconds time: - Prone Hip Extension with Pillow Under Abdomen  - 3 x weekly - 3 sets - 20 reps - Seated Diaphragmatic Breathing   ASSESSMENT:  CLINICAL IMPRESSION: Patient tolerated treatment well with no increase in pain. She continues to be able to complete progressively more challenging exercises without increased pain in her back or right LE. However, she also continues to have significant limitations due to to pain with prolonged walking. Plan to complete progress note next session. Patient would benefit from continued management of limiting condition by skilled physical therapist to address remaining impairments and functional limitations to work towards stated goals and return to PLOF or maximal functional independence.   Patient is a 66 y.o. female referred to outpatient physical therapy with a medical diagnosis of other secondary scoliosis, lumbar region who presents with signs and symptoms consistent with acute on chronic bilateral low back pain with intermittent radiation to right LE below  the knee in the context of levoscoliosis mainly at the lumbar region. Her referring provider has recommended major surgery later in the year for her low back.  Patient presents with significant pain, ROM, posture, joint stiffness, muscle tension, motor control, muscle performance (strength/power/endurance) and activity tolerance impairments that are limiting ability to complete her usual activities such as prolonged positions, walking, sitting, working, hobbies, lifting, moving to a new house, going up and down stairs, etc  without difficulty and decreases her quality of life. Patient will benefit from skilled physical therapy intervention to address current body structure impairments and activity limitations to improve function and work towards goals set in current POC in order to return to prior level of function or maximal functional improvement.   OBJECTIVE IMPAIRMENTS decreased activity tolerance, decreased balance, decreased endurance, decreased mobility, difficulty walking, decreased ROM, decreased strength, hypomobility, impaired perceived functional ability, increased muscle spasms, impaired flexibility, impaired sensation, postural dysfunction, and pain.   ACTIVITY LIMITATIONS community activity, occupation, and difficulty with prolonged positions, walking, sitting, working, hobbies, lifting, moving to a new house, going up and down stairs, etc.    PERSONAL FACTORS Age, Past/current experiences, Time since onset of injury/illness/exacerbation, and 3+ comorbidities: reactive airway disease, HTN, GERD, hx of cervical myelopathy with gait disruption (ACDF 04/22/2022 at C4-5), osteopenia, pure hypercholesterolemia, hypothyroidism, unexplained dropping things and stumbling are also affecting patient's functional outcome.    REHAB POTENTIAL: Good  CLINICAL DECISION MAKING: Evolving/moderate complexity  EVALUATION COMPLEXITY: Moderate   GOALS: Goals reviewed with patient? No  SHORT TERM GOALS:  Target date: 11/01/2021  Patient will be independent with initial home exercise program for self-management of symptoms. Baseline: Initial HEP to be provided at visit 2 as appropriate (10/18/21); Initial HEP provided visit 2 (10/20/2021);  Goal status: Achieved   LONG TERM GOALS: Target date: 01/10/2022  Patient will be independent with a long-term home exercise program for self-management of symptoms.  Baseline: Initial HEP to be provided at visit 2 as appropriate (10/18/21); initial HEP provided visit 2 (10/20/2021); currently participation appropriately (11/17/2021);  Goal status: IN PROGRESS  2.  Patient will demonstrate improved FOTO to equal or greater than 62 by visit #  13 to demonstrate improvement in overall condition and self-reported functional ability.  Baseline: 50 (10/18/21); 53 at visit #5 (10/31/2021); 42 at visit #10 (11/17/2021);  Goal status: ongoing   3.  Reduce pain with functional activities to equal or less than 1/10 to allow patient to complete usual activities including housework, walking, bending with less difficulty.   Baseline: up to 9/10 (10/18/21); up to 8/10 (11/17/2021);  Goal status: IN PROGRESS  4.  Patient will demonstrate right hip strength equal or greater than left hip strength without increase in symptoms to improve ability to perform functional activities such as navigating stairs, walking, and lifting with less difficulty.  Baseline: 1/2 MMT grade lower on R than left and symptomatic in some motions - see objective exam (10/18/21); improved strength in some motions and not painful in any motion  - see objective (11/17/2021);  Goal status: IN PROGRESS  5.  Patient will complete community, work and/or recreational activities without limitation due to current condition.  Baseline: difficulty with prolonged positions, walking, sitting, working, hobbies, lifting, moving to a new house, going up and down stairs, etc.  (10/18/21); continues to have difficulty with  these activities at about the same rate (11/17/2021);  Goal status: On-going    PLAN: PT FREQUENCY: 1-2x/week  PT DURATION: 12 weeks  PLANNED INTERVENTIONS: Therapeutic exercises, Therapeutic activity, Neuromuscular re-education, Balance training, Patient/Family education, Joint mobilization, Dry Needling, Electrical stimulation, Spinal mobilization, Cryotherapy, Moist heat, and Manual therapy.  PLAN FOR NEXT SESSION: update HEP as appropriate, progressive, core, hip and functional strengthening, manual therapy as needed   Everlean Alstrom. Graylon Good, PT, DPT 12/27/21, 6:28 PM  [facility] [address] [place], [place] [address] P: [phone] I F: [phone]

## 2021-12-29 ENCOUNTER — Encounter: Payer: Self-pay | Admitting: Physical Therapy

## 2021-12-29 ENCOUNTER — Ambulatory Visit: Payer: Medicare Other | Admitting: Physical Therapy

## 2021-12-29 DIAGNOSIS — M5459 Other low back pain: Secondary | ICD-10-CM

## 2021-12-29 DIAGNOSIS — M25551 Pain in right hip: Secondary | ICD-10-CM

## 2021-12-29 DIAGNOSIS — R262 Difficulty in walking, not elsewhere classified: Secondary | ICD-10-CM

## 2021-12-29 DIAGNOSIS — M5416 Radiculopathy, lumbar region: Secondary | ICD-10-CM

## 2021-12-29 NOTE — Therapy (Addendum)
OUTPATIENT PHYSICAL THERAPY TREATMENT NOTE / PROGRESS NOTE / Re-Certification Dates of reporting from 11/17/2021 to 12/29/2021   Patient Name: Kathryn Whitehead MRN: [id number] DOB:[birth year], 66 y.o., female Today's Date: 12/29/2021   PT End of Session - 12/29/21 1737     Visit Number 20    Number of Visits 55    Date for PT Re-Evaluation 03/23/22    Authorization Type MEDICARE PART B reporting period from 11/17/2021    Authorization Time Period Cert 8/75/64-09/10/27    Progress Note Due on Visit 20    PT Start Time 1737    PT Stop Time 1817    PT Time Calculation (min) 40 min    Activity Tolerance Patient tolerated treatment well    Behavior During Therapy WFL for tasks assessed/performed             Past Medical History:  Diagnosis Date   Degenerative disc disease, lumbar    Difficult intubation    GERD (gastroesophageal reflux disease)    Hypercholesterolemia    Hypertension    Hypothyroidism    Late latent syphilis    Osteopenia    Reactive airway disease, moderate persistent, uncomplicated    Sinus bradycardia    Thyroid disease    Past Surgical History:  Procedure Laterality Date   ANTERIOR CERVICAL DECOMP/DISCECTOMY FUSION N/A 04/23/2019   Procedure: ANTERIOR CERVICAL DECOMPRESSION/DISCECTOMY FUSION 1 LEVEL C4-5;  Surgeon: Meade Maw, MD;  Location: [facility];  Service: Neurosurgery;  Laterality: N/A;   BACK SURGERY  1994   herniated disc removed lumbar area [facility])   [place]   COLONOSCOPY     COLONOSCOPY WITH PROPOFOL N/A 04/29/2021   Procedure: COLONOSCOPY WITH PROPOFOL;  Surgeon: Annamaria Helling, DO;  Location: [place];  Service: Gastroenterology;  Laterality: N/A;   TONSILLECTOMY     TONSILLECTOMY AND ADENOIDECTOMY     teenager   TUBAL LIGATION     Patient Active Problem List   Diagnosis Date Noted   Cervical myelopathy ([place]) 04/23/2019   Osteopenia of multiple sites 10/23/2018   Late latent syphilis 01/08/2018    Sinus bradycardia by electrocardiogram 11/07/2017   RAD (reactive airway disease), moderate persistent, uncomplicated 51/88/4166   Chronic midline low back pain without sciatica 09/05/2016   Hot flashes, menopausal 09/05/2016   Mid back pain on left side 09/05/2016   Postmenopausal 03/30/2016   Gastroesophageal reflux disease without esophagitis 12/24/2014   Degenerative disc disease, lumbar 12/24/2014   Acquired hypothyroidism 09/12/2014   Benign essential hypertension 09/12/2014   Pure hypercholesterolemia 09/12/2014    PCP: Marinda Elk, MD  REFERRING PROVIDER: Starling Manns, MD  REFERRING DIAG: secondary scoliosis, lumbar region   THERAPY DIAG:  Other low back pain - Plan: PT plan of care cert/re-cert  Radiculopathy, lumbar region - Plan: PT plan of care cert/re-cert  Pain in right hip - Plan: PT plan of care cert/re-cert  Difficulty in walking, not elsewhere classified - Plan: PT plan of care cert/re-cert  Rationale for Evaluation and Treatment: Rehabilitation  ONSET DATE: chronic from about 1994, but several months ago got worse  PERTINENT HISTORY: Patient is a 66 y.o. female who presents to outpatient physical therapy with a referral for medical diagnosis other secondary scoliosis, lumbar region. This patient's chief complaints consist of low back pain with radiation to right LE knee and medial calf leading to the following functional deficits: difficulty with prolonged positions, walking, sitting, working, hobbies, lifting, moving to a new house, going up and  down stairs, etc. Relevant past medical history and comorbidities include reactive airway disease, HTN, GERD, hx of cervical myelopathy with gait disruption (ACDF 04/22/2022 at C4-5), osteopenia, pure hypercholesterolemia, hypothyroidism, unexplained dropping things and stumbling, latex sensitivity.  Patient denies hx of cancer, stroke, seizures, heart problems, diabetes, unexplained weight loss, and unexplained  changes in bowel or bladder problems. She has had some unexplained dropping things. She is planniing to have major surgery in her low back (possibly at mid thoracic to sacral fusion?) in November 2023.   PRECAUTIONS: none  PATIENT GOALS: "to try to reduce some of the pain"  SUBJECTIVE:  Patient reports she is feeling well and her pain has not been too bad lately. She walked on the treadmill without her back wrap today for maybe 5 min and did not have any pain. She has been pain off and on here and there and her right knee still gets numb off and on. She feels like she has benefited from PT, but so far it has been unable to resolve all of her symptoms. She is still quite limited in standing and walking. She can stand or walk up to approximately 10 before her back starts hurting.   PAIN:  Are you having pain? no    OBJECTIVE  SELF-REPORTED FUNCTION FOTO score: 52/100 (lumbar spine questionnaire)  MUSCLE PERFORMANCE (MMT):  *Indicates pain 10/18/21 11/17/21 12/29/21  Joint/Motion R/L R/L R/L  Hip        Flexion (L1, L2) 4+/4+ 5/4+ 5/5  Extension (knee ext) 4/4+ 4+/4+ 4+/5  Abduction 3+/4 4+/4 4+/4+  Knee        Extension (L3) 5/5 / /  Flexion (S2) 5/5 / /  Ankle/Foot        Dorsiflexion (L4) 4+/5 / /  Great toe extension (L5) 5/5 / /  Eversion (S1) 5/4+ / /  Plantarflexion (S1) 4+/4+ / /  Comments: muscle cramp at left upper abdominal quadrant with R hip flexion. Cramping at R hip adductors with R hip abduction test.                                                                                                                                                                                      TODAY'S TREATMENT  Therapeutic exercise: to centralize symptoms and improve ROM, strength, muscular endurance, and activity tolerance required for successful completion of functional activities.  - NuStep level 6 using bilateral upper and lower extremities. Seat/handle setting 6/8. For improved  extremity mobility, muscular endurance, and activity tolerance; and to induce the analgesic effect of aerobic exercise, stimulate improved joint nutrition, and prepare body structures and systems for following interventions. x 5  minutes. Average SPM = 74. - measurements to assess progress (see above).  - modified bird dog with abdomen resting on green theraball, touching mat with toes and hands, 3x10 with feet as far back as needed to find appropriate challenge.  - standing modified side plank on high plinth, elbow to feet 3x30 seconds on each side.  - seated autoelongation with fingers on top of shoulders with diaphragmatic breathing to review for HEP.  - Education on HEP including handout     Pt required multimodal cuing for proper technique and to facilitate improved neuromuscular control, strength, range of motion, and functional ability resulting in improved performance and form.   PATIENT EDUCATION:  Education details: Exercise purpose/form. Self management techniques. POC, progress, Person educated: Patient Education method: Explanation and Demonstration Education comprehension: verbalized understanding and needs further education   HOME EXERCISE PROGRAM: Access Code: [email or web address] URL: [email or web address] Date: 12/29/2021 Prepared by: Rosita Kea  Exercises - Beginner Flat Bicycle  - 3 x weekly - 3 sets - 30 seconds time: - Bird Dog on Counter  - 3 x weekly - 3 sets - 10 reps - Forearm Side Plank on Counter  - 3 x weekly - 3 sets - 30 seconds hold  [place] Sitting at [place] with poles -   ASSESSMENT:  CLINICAL IMPRESSION: Patient has attended 20 physical therapy sessions since starting her current episode of care on 10/18/2021. Patient has made progress towards most goals including improved hip strength, decreased pain, improved ability to complete usual functional tasks, and participation in HEP. She had not significantly improved on  her FOTO questionnaire (self-reported function) and she continues to be limited in standing and walking to about 10 min before she starts to get pain. Her core strength and activity tolerance has improved as noted in her ability to complete more demanding exercises in the clinic. She would benefit from continued PT to further decrease pain and improve function. Plan to try reducing visit frequency to 1x a week to work towards increased independence and self management while continuing to progress towards goals. Patient would benefit from continued management of limiting condition by skilled physical therapist to address remaining impairments and functional limitations to work towards stated goals and return to PLOF or maximal functional independence.   Patient is a 66 y.o. female referred to outpatient physical therapy with a medical diagnosis of other secondary scoliosis, lumbar region who presents with signs and symptoms consistent with acute on chronic bilateral low back pain with intermittent radiation to right LE below the knee in the context of levoscoliosis mainly at the lumbar region. Her referring provider has recommended major surgery later in the year for her low back.  Patient presents with significant pain, ROM, posture, joint stiffness, muscle tension, motor control, muscle performance (strength/power/endurance) and activity tolerance impairments that are limiting ability to complete her usual activities such as prolonged positions, walking, sitting, working, hobbies, lifting, moving to a new house, going up and down stairs, etc  without difficulty and decreases her quality of life. Patient will benefit from skilled physical therapy intervention to address current body structure impairments and activity limitations to improve function and work towards goals set in current POC in order to return to prior level of function or maximal functional improvement.   OBJECTIVE IMPAIRMENTS decreased activity  tolerance, decreased balance, decreased endurance, decreased mobility, difficulty walking, decreased ROM, decreased strength, hypomobility, impaired perceived functional ability, increased muscle spasms, impaired flexibility, impaired sensation, postural dysfunction, and pain.  ACTIVITY LIMITATIONS community activity, occupation, and difficulty with prolonged positions, walking, sitting, working, hobbies, lifting, moving to a new house, going up and down stairs, etc.    PERSONAL FACTORS Age, Past/current experiences, Time since onset of injury/illness/exacerbation, and 3+ comorbidities: reactive airway disease, HTN, GERD, hx of cervical myelopathy with gait disruption (ACDF 04/22/2022 at C4-5), osteopenia, pure hypercholesterolemia, hypothyroidism, unexplained dropping things and stumbling are also affecting patient's functional outcome.    REHAB POTENTIAL: Good  CLINICAL DECISION MAKING: Evolving/moderate complexity  EVALUATION COMPLEXITY: Moderate   GOALS: Goals reviewed with patient? No  SHORT TERM GOALS: Target date: 11/01/2021  Patient will be independent with initial home exercise program for self-management of symptoms. Baseline: Initial HEP to be provided at visit 2 as appropriate (10/18/21); Initial HEP provided visit 2 (10/20/2021);  Goal status: Achieved   LONG TERM GOALS: Target date: 01/10/2022; updated to 03/21/2022 for all unmet goals on 12/29/2021  Patient will be independent with a long-term home exercise program for self-management of symptoms.  Baseline: Initial HEP to be provided at visit 2 as appropriate (10/18/21); initial HEP provided visit 2 (10/20/2021); currently participating appropriately (11/17/2021; 12/29/2021);  Goal status: In progress  2.  Patient will demonstrate improved FOTO to equal or greater than 62 by visit #13 to demonstrate improvement in overall condition and self-reported functional ability.  Baseline: 50 (10/18/21); 53 at visit #5 (10/31/2021); 42 at  visit #10 (11/17/2021); 52 at visit #20 (12/29/2021);  Goal status: ongoing   3.  Reduce pain with functional activities to equal or less than 1/10 to allow patient to complete usual activities including housework, walking, bending with less difficulty.   Baseline: up to 9/10 (10/18/21); up to 8/10 (11/17/2021); up to 5/10 (12/29/2021);  Goal status: IN PROGRESS  4.  Patient will demonstrate right hip strength equal or greater than left hip strength without increase in symptoms to improve ability to perform functional activities such as navigating stairs, walking, and lifting with less difficulty.  Baseline: 1/2 MMT grade lower on R than left and symptomatic in some motions - see objective exam (10/18/21); improved strength in some motions and not painful in any motion  - see objective (11/17/2021); nearly met - see objective (12/29/2021);  Goal status: Nearly met  5.  Patient will complete community, work and/or recreational activities without limitation due to current condition.  Baseline: difficulty with prolonged positions, walking, sitting, working, hobbies, lifting, moving to a new house, going up and down stairs, etc.  (10/18/21); continues to have difficulty with these activities at about the same rate (11/17/2021); Patient reports all of these things have gotten better, but she is still limited by how long she can stand and walk. She rates her improvement since starting PT at approximately 50% improved (12/29/2021);  Goal status: In-progress.     PLAN: PT FREQUENCY: 1-2x/week  PT DURATION: 12 weeks  PLANNED INTERVENTIONS: Therapeutic exercises, Therapeutic activity, Neuromuscular re-education, Balance training, Patient/Family education, Joint mobilization, Dry Needling, Electrical stimulation, Spinal mobilization, Cryotherapy, Moist heat, and Manual therapy.  PLAN FOR NEXT SESSION: update HEP as appropriate, progressive, core, hip and functional strengthening, manual therapy as  needed   Everlean Alstrom. Graylon Good, PT, DPT 12/29/21, 6:40 PM  [facility] [address] [place], [place] [address] P: [phone] I F: [phone]

## 2022-01-03 ENCOUNTER — Ambulatory Visit: Payer: Medicare Other | Admitting: Physical Therapy

## 2022-01-05 ENCOUNTER — Encounter: Payer: Self-pay | Admitting: Physical Therapy

## 2022-01-05 ENCOUNTER — Ambulatory Visit: Payer: Medicare Other | Admitting: Physical Therapy

## 2022-01-05 DIAGNOSIS — M5416 Radiculopathy, lumbar region: Secondary | ICD-10-CM

## 2022-01-05 DIAGNOSIS — R262 Difficulty in walking, not elsewhere classified: Secondary | ICD-10-CM

## 2022-01-05 DIAGNOSIS — M25551 Pain in right hip: Secondary | ICD-10-CM

## 2022-01-05 DIAGNOSIS — M5459 Other low back pain: Secondary | ICD-10-CM

## 2022-01-05 NOTE — Therapy (Signed)
OUTPATIENT PHYSICAL THERAPY TREATMENT NOTE   Patient Name: Kathryn Whitehead MRN: [id number] DOB:1956-06-16, 66 y.o., female Today's Date: 01/05/2022   PT End of Session - 01/05/22 1850     Visit Number 21    Number of Visits 28    Date for PT Re-Evaluation 03/23/22    Authorization Type MEDICARE PART B reporting period from 12/29/2021    Progress Note Due on Visit 79    PT Start Time 4098    PT Stop Time 1900    PT Time Calculation (min) 40 min    Activity Tolerance Patient tolerated treatment well    Behavior During Therapy WFL for tasks assessed/performed              Past Medical History:  Diagnosis Date   Degenerative disc disease, lumbar    Difficult intubation    GERD (gastroesophageal reflux disease)    Hypercholesterolemia    Hypertension    Hypothyroidism    Late latent syphilis    Osteopenia    Reactive airway disease, moderate persistent, uncomplicated    Sinus bradycardia    Thyroid disease    Past Surgical History:  Procedure Laterality Date   ANTERIOR CERVICAL DECOMP/DISCECTOMY FUSION N/A 04/23/2019   Procedure: ANTERIOR CERVICAL DECOMPRESSION/DISCECTOMY FUSION 1 LEVEL C4-5;  Surgeon: Meade Maw, MD;  Location: [facility];  Service: Neurosurgery;  Laterality: N/A;   BACK SURGERY  1994   herniated disc removed lumbar area [facility])   [place]   COLONOSCOPY     COLONOSCOPY WITH PROPOFOL N/A 04/29/2021   Procedure: COLONOSCOPY WITH PROPOFOL;  Surgeon: Annamaria Helling, DO;  Location: [place];  Service: Gastroenterology;  Laterality: N/A;   TONSILLECTOMY     TONSILLECTOMY AND ADENOIDECTOMY     teenager   TUBAL LIGATION     Patient Active Problem List   Diagnosis Date Noted   Cervical myelopathy ([place]) 04/23/2019   Osteopenia of multiple sites 10/23/2018   Late latent syphilis 01/08/2018   Sinus bradycardia by electrocardiogram 11/07/2017   RAD (reactive airway disease), moderate persistent, uncomplicated 11/91/4782    Chronic midline low back pain without sciatica 09/05/2016   Hot flashes, menopausal 09/05/2016   Mid back pain on left side 09/05/2016   Postmenopausal 03/30/2016   Gastroesophageal reflux disease without esophagitis 12/24/2014   Degenerative disc disease, lumbar 12/24/2014   Acquired hypothyroidism 09/12/2014   Benign essential hypertension 09/12/2014   Pure hypercholesterolemia 09/12/2014    PCP: Marinda Elk, MD  REFERRING PROVIDER: Starling Manns, MD  REFERRING DIAG: secondary scoliosis, lumbar region   THERAPY DIAG:  Other low back pain  Radiculopathy, lumbar region  Pain in right hip  Difficulty in walking, not elsewhere classified  Rationale for Evaluation and Treatment: Rehabilitation  ONSET DATE: chronic from about 1994, but several months ago got worse  PERTINENT HISTORY: Patient is a 66 y.o. female who presents to outpatient physical therapy with a referral for medical diagnosis other secondary scoliosis, lumbar region. This patient's chief complaints consist of low back pain with radiation to right LE knee and medial calf leading to the following functional deficits: difficulty with prolonged positions, walking, sitting, working, hobbies, lifting, moving to a new house, going up and down stairs, etc. Relevant past medical history and comorbidities include reactive airway disease, HTN, GERD, hx of cervical myelopathy with gait disruption (ACDF 04/22/2022 at C4-5), osteopenia, pure hypercholesterolemia, hypothyroidism, unexplained dropping things and stumbling, latex sensitivity.  Patient denies hx of cancer, stroke, seizures, heart problems,  diabetes, unexplained weight loss, and unexplained changes in bowel or bladder problems. She has had some unexplained dropping things. She is planniing to have major surgery in her low back (possibly at mid thoracic to sacral fusion?) in November 2023.   PRECAUTIONS: none  PATIENT GOALS: "to try to reduce some of the  pain"  SUBJECTIVE:  Patient reports she has no pain upon arrival and no pain today. She started doing her new HEP today. She did have a day earlier this week when she walked on the TM 5-6 min at 2.5 mph at 5% incline. Her back was fine while walking on in but she started having stinging in her R foot when she got back to her desk. She walked on it again today with a slower speed and lower incline without a problem.   PAIN:  Are you having pain? no    OBJECTIVE                                                                                                                                                                                    TODAY'S TREATMENT  Therapeutic exercise: to centralize symptoms and improve ROM, strength, muscular endurance, and activity tolerance required for successful completion of functional activities.  - NuStep level 6 using bilateral upper and lower extremities. Seat/handle setting 6/8. For improved extremity mobility, muscular endurance, and activity tolerance; and to induce the analgesic effect of aerobic exercise, stimulate improved joint nutrition, and prepare body structures and systems for following interventions. x 5  minutes. Average SPM = 77.  Circuit: - Seated lat pull, 3x15 with 25# - plank to alternating side plank at high plinth from elbows to toes, 3x10 each direction.    Circuit: - standing TRX row, 3x10 - inclined "Y" leaning prone on TOTAL GYM set at level 26 with 2# DB, 3x10  - hooklying bicycling with abdominal brace, 2x30 seconds.  - standing squat with buttocks touch in 18.5 inch plinth, 3x10 - standing figure 8 swing with 1kg med ball in pillow case, 3x30 seconds.      Pt required multimodal cuing for proper technique and to facilitate improved neuromuscular control, strength, range of motion, and functional ability resulting in improved performance and form.   PATIENT EDUCATION:  Education details: Exercise purpose/form. Self management  techniques.  Person educated: Patient Education method: Customer service manager Education comprehension: verbalized understanding and needs further education   HOME EXERCISE PROGRAM: Access Code: [email or web address] URL: [email or web address] Date: 12/29/2021 Prepared by: Rosita Kea  Exercises - Beginner Flat Bicycle  - 3 x weekly - 3 sets - 30 seconds time: - Bird Dog on Counter  - 3 x weekly -  3 sets - 10 reps - Forearm Side Plank on Counter  - 3 x weekly - 3 sets - 30 seconds hold  [place] Sitting at [place] with poles -   ASSESSMENT:  CLINICAL IMPRESSION: Patient tolerated treatment well with no increase in pain by end of session. She was able to progress to more dynamic core exercises and continue with previous strengthening exercises. Patient would benefit from spacing out her HEP form her PT visits for maximum benefit. Patient would benefit from continued management of limiting condition by skilled physical therapist to address remaining impairments and functional limitations to work towards stated goals and return to PLOF or maximal functional independence.   Patient is a 66 y.o. female referred to outpatient physical therapy with a medical diagnosis of other secondary scoliosis, lumbar region who presents with signs and symptoms consistent with acute on chronic bilateral low back pain with intermittent radiation to right LE below the knee in the context of levoscoliosis mainly at the lumbar region. Her referring provider has recommended major surgery later in the year for her low back.  Patient presents with significant pain, ROM, posture, joint stiffness, muscle tension, motor control, muscle performance (strength/power/endurance) and activity tolerance impairments that are limiting ability to complete her usual activities such as prolonged positions, walking, sitting, working, hobbies, lifting, moving to a new house, going up and down stairs,  etc  without difficulty and decreases her quality of life. Patient will benefit from skilled physical therapy intervention to address current body structure impairments and activity limitations to improve function and work towards goals set in current POC in order to return to prior level of function or maximal functional improvement.   OBJECTIVE IMPAIRMENTS decreased activity tolerance, decreased balance, decreased endurance, decreased mobility, difficulty walking, decreased ROM, decreased strength, hypomobility, impaired perceived functional ability, increased muscle spasms, impaired flexibility, impaired sensation, postural dysfunction, and pain.   ACTIVITY LIMITATIONS community activity, occupation, and difficulty with prolonged positions, walking, sitting, working, hobbies, lifting, moving to a new house, going up and down stairs, etc.    PERSONAL FACTORS Age, Past/current experiences, Time since onset of injury/illness/exacerbation, and 3+ comorbidities: reactive airway disease, HTN, GERD, hx of cervical myelopathy with gait disruption (ACDF 04/22/2022 at C4-5), osteopenia, pure hypercholesterolemia, hypothyroidism, unexplained dropping things and stumbling are also affecting patient's functional outcome.    REHAB POTENTIAL: Good  CLINICAL DECISION MAKING: Evolving/moderate complexity  EVALUATION COMPLEXITY: Moderate   GOALS: Goals reviewed with patient? No  SHORT TERM GOALS: Target date: 11/01/2021  Patient will be independent with initial home exercise program for self-management of symptoms. Baseline: Initial HEP to be provided at visit 2 as appropriate (10/18/21); Initial HEP provided visit 2 (10/20/2021);  Goal status: Achieved   LONG TERM GOALS: Target date: 01/10/2022; updated to 03/21/2022 for all unmet goals on 12/29/2021  Patient will be independent with a long-term home exercise program for self-management of symptoms.  Baseline: Initial HEP to be provided at visit 2 as  appropriate (10/18/21); initial HEP provided visit 2 (10/20/2021); currently participating appropriately (11/17/2021; 12/29/2021);  Goal status: In progress  2.  Patient will demonstrate improved FOTO to equal or greater than 62 by visit #13 to demonstrate improvement in overall condition and self-reported functional ability.  Baseline: 50 (10/18/21); 53 at visit #5 (10/31/2021); 42 at visit #10 (11/17/2021); 52 at visit #20 (12/29/2021);  Goal status: ongoing   3.  Reduce pain with functional activities to equal or less than 1/10 to allow patient to complete  usual activities including housework, walking, bending with less difficulty.   Baseline: up to 9/10 (10/18/21); up to 8/10 (11/17/2021); up to 5/10 (12/29/2021);  Goal status: IN PROGRESS  4.  Patient will demonstrate right hip strength equal or greater than left hip strength without increase in symptoms to improve ability to perform functional activities such as navigating stairs, walking, and lifting with less difficulty.  Baseline: 1/2 MMT grade lower on R than left and symptomatic in some motions - see objective exam (10/18/21); improved strength in some motions and not painful in any motion  - see objective (11/17/2021); nearly met - see objective (12/29/2021);  Goal status: Nearly met  5.  Patient will complete community, work and/or recreational activities without limitation due to current condition.  Baseline: difficulty with prolonged positions, walking, sitting, working, hobbies, lifting, moving to a new house, going up and down stairs, etc.  (10/18/21); continues to have difficulty with these activities at about the same rate (11/17/2021); Patient reports all of these things have gotten better, but she is still limited by how long she can stand and walk. She rates her improvement since starting PT at approximately 50% improved (12/29/2021);  Goal status: In-progress.     PLAN: PT FREQUENCY: 1-2x/week  PT DURATION: 12 weeks  PLANNED  INTERVENTIONS: Therapeutic exercises, Therapeutic activity, Neuromuscular re-education, Balance training, Patient/Family education, Joint mobilization, Dry Needling, Electrical stimulation, Spinal mobilization, Cryotherapy, Moist heat, and Manual therapy.  PLAN FOR NEXT SESSION: update HEP as appropriate, progressive, core, hip and functional strengthening, manual therapy as needed   Everlean Alstrom. Graylon Good, PT, DPT 01/05/22, 7:18 PM  [facility] [address] [place], [place] [address] P: [phone] I F: [phone]

## 2022-01-09 ENCOUNTER — Encounter: Payer: Medicare Other | Admitting: Physical Therapy

## 2022-01-11 ENCOUNTER — Encounter: Payer: Self-pay | Admitting: Physical Therapy

## 2022-01-11 ENCOUNTER — Ambulatory Visit: Payer: Medicare Other | Attending: Orthopaedic Surgery | Admitting: Physical Therapy

## 2022-01-11 DIAGNOSIS — M25551 Pain in right hip: Secondary | ICD-10-CM | POA: Diagnosis present

## 2022-01-11 DIAGNOSIS — M5416 Radiculopathy, lumbar region: Secondary | ICD-10-CM

## 2022-01-11 DIAGNOSIS — M5459 Other low back pain: Secondary | ICD-10-CM

## 2022-01-11 DIAGNOSIS — R262 Difficulty in walking, not elsewhere classified: Secondary | ICD-10-CM | POA: Diagnosis present

## 2022-01-11 NOTE — Therapy (Signed)
OUTPATIENT PHYSICAL THERAPY TREATMENT NOTE   Patient Name: Kathryn Whitehead MRN: [id number] DOB:1955-09-25, 66 y.o., female Today's Date: 01/11/2022   PT End of Session - 01/11/22 1821     Visit Number 22    Number of Visits 47    Date for PT Re-Evaluation 03/23/22    Authorization Type MEDICARE PART B reporting period from 12/29/2021    Progress Note Due on Visit 45    PT Start Time 3267    PT Stop Time 1900    PT Time Calculation (min) 40 min    Activity Tolerance Patient tolerated treatment well    Behavior During Therapy WFL for tasks assessed/performed               Past Medical History:  Diagnosis Date   Degenerative disc disease, lumbar    Difficult intubation    GERD (gastroesophageal reflux disease)    Hypercholesterolemia    Hypertension    Hypothyroidism    Late latent syphilis    Osteopenia    Reactive airway disease, moderate persistent, uncomplicated    Sinus bradycardia    Thyroid disease    Past Surgical History:  Procedure Laterality Date   ANTERIOR CERVICAL DECOMP/DISCECTOMY FUSION N/A 04/23/2019   Procedure: ANTERIOR CERVICAL DECOMPRESSION/DISCECTOMY FUSION 1 LEVEL C4-5;  Surgeon: Meade Maw, MD;  Location: [facility];  Service: Neurosurgery;  Laterality: N/A;   BACK SURGERY  1994   herniated disc removed lumbar area [facility])   [place]   COLONOSCOPY     COLONOSCOPY WITH PROPOFOL N/A 04/29/2021   Procedure: COLONOSCOPY WITH PROPOFOL;  Surgeon: Annamaria Helling, DO;  Location: [place];  Service: Gastroenterology;  Laterality: N/A;   TONSILLECTOMY     TONSILLECTOMY AND ADENOIDECTOMY     teenager   TUBAL LIGATION     Patient Active Problem List   Diagnosis Date Noted   Cervical myelopathy ([place]) 04/23/2019   Osteopenia of multiple sites 10/23/2018   Late latent syphilis 01/08/2018   Sinus bradycardia by electrocardiogram 11/07/2017   RAD (reactive airway disease), moderate persistent, uncomplicated  12/45/8099   Chronic midline low back pain without sciatica 09/05/2016   Hot flashes, menopausal 09/05/2016   Mid back pain on left side 09/05/2016   Postmenopausal 03/30/2016   Gastroesophageal reflux disease without esophagitis 12/24/2014   Degenerative disc disease, lumbar 12/24/2014   Acquired hypothyroidism 09/12/2014   Benign essential hypertension 09/12/2014   Pure hypercholesterolemia 09/12/2014    PCP: Marinda Elk, MD  REFERRING PROVIDER: Starling Manns, MD  REFERRING DIAG: secondary scoliosis, lumbar region   THERAPY DIAG:  Other low back pain  Radiculopathy, lumbar region  Pain in right hip  Difficulty in walking, not elsewhere classified  Rationale for Evaluation and Treatment: Rehabilitation  ONSET DATE: chronic from about 1994, but several months ago got worse  PERTINENT HISTORY: Patient is a 66 y.o. female who presents to outpatient physical therapy with a referral for medical diagnosis other secondary scoliosis, lumbar region. This patient's chief complaints consist of low back pain with radiation to right LE knee and medial calf leading to the following functional deficits: difficulty with prolonged positions, walking, sitting, working, hobbies, lifting, moving to a new house, going up and down stairs, etc. Relevant past medical history and comorbidities include reactive airway disease, HTN, GERD, hx of cervical myelopathy with gait disruption (ACDF 04/22/2022 at C4-5), osteopenia, pure hypercholesterolemia, hypothyroidism, unexplained dropping things and stumbling, latex sensitivity.  Patient denies hx of cancer, stroke, seizures, heart  problems, diabetes, unexplained weight loss, and unexplained changes in bowel or bladder problems. She has had some unexplained dropping things. She is planniing to have major surgery in her low back (possibly at mid thoracic to sacral fusion?) in November 2023.   PRECAUTIONS: none  PATIENT GOALS: "to try to reduce some of  the pain"  SUBJECTIVE:  Patient report she is feeling okay today and has no pain upon arrival. She has been walking on the TM at work for her morning and evening break. She has been working at being more active lately because she feels she is gaining weight. She has been getting some numbness in her right great toe off and on. This morning she had increased back pain and had to take some meloxicam and tylenol which helped her pain. She did her HEP yesterday and today.   PAIN:  Are you having pain?  Yes, 1/10 in the low back.     OBJECTIVE                                                                                                                                                                                    TODAY'S TREATMENT  Therapeutic exercise: to centralize symptoms and improve ROM, strength, muscular endurance, and activity tolerance required for successful completion of functional activities.  - NuStep level 6 using bilateral upper and lower extremities. Seat/handle setting 6/8. For improved extremity mobility, muscular endurance, and activity tolerance; and to induce the analgesic effect of aerobic exercise, stimulate improved joint nutrition, and prepare body structures and systems for following interventions. x 5  minutes. Average SPM = 79.  Circuit: - Seated lat pull, 3x10 with 30# - plank to alternating side plank at high plinth from elbows to toes, 3x10 each direction.   - standing hip hinge to touch hands on 17 inch chair with knees up against front of chair, 1x10. VC/TC for hip hinge   Circuit: - standing TRX row, 3x10 - bent over reverse fly exercise with knees against front of chair and fists touching seat of chair, AROM. VC/TC for hip hinge and scapular motion.   Circuit: - standing figure 8 swing with 1kg med ball in pillow case, 3x30 seconds.   - standing squat with buttocks touch on 17 inch chair while holding 4# DB at chest, 3x10    Pt required multimodal cuing  for proper technique and to facilitate improved neuromuscular control, strength, range of motion, and functional ability resulting in improved performance and form.   PATIENT EDUCATION:  Education details: Exercise purpose/form. Self management techniques.  Person educated: Patient Education method: Customer service manager Education comprehension: verbalized understanding and needs further education  HOME EXERCISE PROGRAM: Access Code: [email or web address] URL: [email or web address] Date: 12/29/2021 Prepared by: Rosita Kea  Exercises - Beginner Flat Bicycle  - 3 x weekly - 3 sets - 30 seconds time: - Bird Dog on Counter  - 3 x weekly - 3 sets - 10 reps - Forearm Side Plank on Counter  - 3 x weekly - 3 sets - 30 seconds hold  Bartlett Schroth Sitting at [place] with poles -   ASSESSMENT:  CLINICAL IMPRESSION: Patient tolerated treatment well with no increase in symptoms by end of session. She continues to demonstrate improving core and functional strength without symptoms. Patient continues to report symptoms outside of PT when she is walking. Patient would benefit from continued management of limiting condition by skilled physical therapist to address remaining impairments and functional limitations to work towards stated goals and return to PLOF or maximal functional independence.   Patient is a 66 y.o. female referred to outpatient physical therapy with a medical diagnosis of other secondary scoliosis, lumbar region who presents with signs and symptoms consistent with acute on chronic bilateral low back pain with intermittent radiation to right LE below the knee in the context of levoscoliosis mainly at the lumbar region. Her referring provider has recommended major surgery later in the year for her low back.  Patient presents with significant pain, ROM, posture, joint stiffness, muscle tension, motor control, muscle performance (strength/power/endurance)  and activity tolerance impairments that are limiting ability to complete her usual activities such as prolonged positions, walking, sitting, working, hobbies, lifting, moving to a new house, going up and down stairs, etc  without difficulty and decreases her quality of life. Patient will benefit from skilled physical therapy intervention to address current body structure impairments and activity limitations to improve function and work towards goals set in current POC in order to return to prior level of function or maximal functional improvement.   OBJECTIVE IMPAIRMENTS decreased activity tolerance, decreased balance, decreased endurance, decreased mobility, difficulty walking, decreased ROM, decreased strength, hypomobility, impaired perceived functional ability, increased muscle spasms, impaired flexibility, impaired sensation, postural dysfunction, and pain.   ACTIVITY LIMITATIONS community activity, occupation, and difficulty with prolonged positions, walking, sitting, working, hobbies, lifting, moving to a new house, going up and down stairs, etc.    PERSONAL FACTORS Age, Past/current experiences, Time since onset of injury/illness/exacerbation, and 3+ comorbidities: reactive airway disease, HTN, GERD, hx of cervical myelopathy with gait disruption (ACDF 04/22/2022 at C4-5), osteopenia, pure hypercholesterolemia, hypothyroidism, unexplained dropping things and stumbling are also affecting patient's functional outcome.    REHAB POTENTIAL: Good  CLINICAL DECISION MAKING: Evolving/moderate complexity  EVALUATION COMPLEXITY: Moderate   GOALS: Goals reviewed with patient? No  SHORT TERM GOALS: Target date: 11/01/2021  Patient will be independent with initial home exercise program for self-management of symptoms. Baseline: Initial HEP to be provided at visit 2 as appropriate (10/18/21); Initial HEP provided visit 2 (10/20/2021);  Goal status: Achieved   LONG TERM GOALS: Target date: 01/10/2022;  updated to 03/21/2022 for all unmet goals on 12/29/2021  Patient will be independent with a long-term home exercise program for self-management of symptoms.  Baseline: Initial HEP to be provided at visit 2 as appropriate (10/18/21); initial HEP provided visit 2 (10/20/2021); currently participating appropriately (11/17/2021; 12/29/2021);  Goal status: In progress  2.  Patient will demonstrate improved FOTO to equal or greater than 62 by visit #13 to demonstrate improvement in overall condition and self-reported functional ability.  Baseline: 50 (10/18/21); 53 at visit #5 (  10/31/2021); 42 at visit #10 (11/17/2021); 52 at visit #20 (12/29/2021);  Goal status: ongoing   3.  Reduce pain with functional activities to equal or less than 1/10 to allow patient to complete usual activities including housework, walking, bending with less difficulty.   Baseline: up to 9/10 (10/18/21); up to 8/10 (11/17/2021); up to 5/10 (12/29/2021);  Goal status: IN PROGRESS  4.  Patient will demonstrate right hip strength equal or greater than left hip strength without increase in symptoms to improve ability to perform functional activities such as navigating stairs, walking, and lifting with less difficulty.  Baseline: 1/2 MMT grade lower on R than left and symptomatic in some motions - see objective exam (10/18/21); improved strength in some motions and not painful in any motion  - see objective (11/17/2021); nearly met - see objective (12/29/2021);  Goal status: Nearly met  5.  Patient will complete community, work and/or recreational activities without limitation due to current condition.  Baseline: difficulty with prolonged positions, walking, sitting, working, hobbies, lifting, moving to a new house, going up and down stairs, etc.  (10/18/21); continues to have difficulty with these activities at about the same rate (11/17/2021); Patient reports all of these things have gotten better, but she is still limited by how long she can  stand and walk. She rates her improvement since starting PT at approximately 50% improved (12/29/2021);  Goal status: In-progress.     PLAN: PT FREQUENCY: 1-2x/week  PT DURATION: 12 weeks  PLANNED INTERVENTIONS: Therapeutic exercises, Therapeutic activity, Neuromuscular re-education, Balance training, Patient/Family education, Joint mobilization, Dry Needling, Electrical stimulation, Spinal mobilization, Cryotherapy, Moist heat, and Manual therapy.  PLAN FOR NEXT SESSION: update HEP as appropriate, progressive, core, hip and functional strengthening, manual therapy as needed   Everlean Alstrom. Graylon Good, PT, DPT 01/11/22, 8:02 PM  [facility] [address] [place], [place] [address] P: [phone] I F: [phone]

## 2022-01-16 ENCOUNTER — Encounter: Payer: Self-pay | Admitting: Physical Therapy

## 2022-01-16 ENCOUNTER — Ambulatory Visit: Payer: Medicare Other | Admitting: Physical Therapy

## 2022-01-16 DIAGNOSIS — M5416 Radiculopathy, lumbar region: Secondary | ICD-10-CM

## 2022-01-16 DIAGNOSIS — M5459 Other low back pain: Secondary | ICD-10-CM | POA: Diagnosis not present

## 2022-01-16 DIAGNOSIS — R262 Difficulty in walking, not elsewhere classified: Secondary | ICD-10-CM

## 2022-01-16 DIAGNOSIS — M25551 Pain in right hip: Secondary | ICD-10-CM

## 2022-01-16 NOTE — Therapy (Addendum)
OUTPATIENT PHYSICAL THERAPY TREATMENT NOTE   Patient Name: Kathryn Whitehead MRN: [id number] DOB:02-Nov-1955, 66 y.o., female Today's Date: 01/16/2022   PT End of Session - 01/16/22 1834     Visit Number 23    Number of Visits 91    Date for PT Re-Evaluation 03/23/22    Authorization Type MEDICARE PART B reporting period from 12/29/2021    Progress Note Due on Visit 53    PT Start Time 1823    PT Stop Time 1901    PT Time Calculation (min) 38 min    Activity Tolerance Patient tolerated treatment well    Behavior During Therapy WFL for tasks assessed/performed                Past Medical History:  Diagnosis Date   Degenerative disc disease, lumbar    Difficult intubation    GERD (gastroesophageal reflux disease)    Hypercholesterolemia    Hypertension    Hypothyroidism    Late latent syphilis    Osteopenia    Reactive airway disease, moderate persistent, uncomplicated    Sinus bradycardia    Thyroid disease    Past Surgical History:  Procedure Laterality Date   ANTERIOR CERVICAL DECOMP/DISCECTOMY FUSION N/A 04/23/2019   Procedure: ANTERIOR CERVICAL DECOMPRESSION/DISCECTOMY FUSION 1 LEVEL C4-5;  Surgeon: Meade Maw, MD;  Location: [facility];  Service: Neurosurgery;  Laterality: N/A;   BACK SURGERY  1994   herniated disc removed lumbar area [place])   [place]   COLONOSCOPY     COLONOSCOPY WITH PROPOFOL N/A 04/29/2021   Procedure: COLONOSCOPY WITH PROPOFOL;  Surgeon: Annamaria Helling, DO;  Location: [place];  Service: Gastroenterology;  Laterality: N/A;   TONSILLECTOMY     TONSILLECTOMY AND ADENOIDECTOMY     teenager   TUBAL LIGATION     Patient Active Problem List   Diagnosis Date Noted   Cervical myelopathy ([place]) 04/23/2019   Osteopenia of multiple sites 10/23/2018   Late latent syphilis 01/08/2018   Sinus bradycardia by electrocardiogram 11/07/2017   RAD (reactive airway disease), moderate persistent, uncomplicated  94/58/5929   Chronic midline low back pain without sciatica 09/05/2016   Hot flashes, menopausal 09/05/2016   Mid back pain on left side 09/05/2016   Postmenopausal 03/30/2016   Gastroesophageal reflux disease without esophagitis 12/24/2014   Degenerative disc disease, lumbar 12/24/2014   Acquired hypothyroidism 09/12/2014   Benign essential hypertension 09/12/2014   Pure hypercholesterolemia 09/12/2014    PCP: Marinda Elk, MD  REFERRING PROVIDER: Starling Manns, MD  REFERRING DIAG: secondary scoliosis, lumbar region   THERAPY DIAG:  Other low back pain  Radiculopathy, lumbar region  Pain in right hip  Difficulty in walking, not elsewhere classified  Rationale for Evaluation and Treatment: Rehabilitation  ONSET DATE: chronic from about 1994, but several months ago got worse  PERTINENT HISTORY: Patient is a 66 y.o. female who presents to outpatient physical therapy with a referral for medical diagnosis other secondary scoliosis, lumbar region. This patient's chief complaints consist of low back pain with radiation to right LE knee and medial calf leading to the following functional deficits: difficulty with prolonged positions, walking, sitting, working, hobbies, lifting, moving to a new house, going up and down stairs, etc. Relevant past medical history and comorbidities include reactive airway disease, HTN, GERD, hx of cervical myelopathy with gait disruption (ACDF 04/22/2022 at C4-5), osteopenia, pure hypercholesterolemia, hypothyroidism, unexplained dropping things and stumbling, latex sensitivity.  Patient denies hx of cancer, stroke, seizures,  heart problems, diabetes, unexplained weight loss, and unexplained changes in bowel or bladder problems. She has had some unexplained dropping things. She is planniing to have major surgery in her low back (possibly at mid thoracic to sacral fusion?) in November 2023.   PRECAUTIONS: none  PATIENT GOALS: "to try to reduce some of  the pain"  SUBJECTIVE:  Patient report she is feeling well but continues to have back pain periodically. She saw her surgeon on Friday and everything looked good for surgery. She is going to meet the surgeon who will be assisting on Wednesday this week. She decided to try to move the surgery up because she has disability insurance and her significant other feels her back pain is worsening. Her doctors are now planning for surgery in August. She states her leg pain is on and off as usual. She currently has 1/10 pain in the middle low back. The surgery is planned in 2 parts with the first with an overnight stay then home for 4 weeks, then the more involved surgery the second time.   PAIN:  Are you having pain?  Yes, 1/10 in the low back.     OBJECTIVE                                                                                                                                                                                    TODAY'S TREATMENT  Therapeutic exercise: to centralize symptoms and improve ROM, strength, muscular endurance, and activity tolerance required for successful completion of functional activities.  - NuStep level 6 using bilateral upper and lower extremities. Seat/handle setting 6/8. For improved extremity mobility, muscular endurance, and activity tolerance; and to induce the analgesic effect of aerobic exercise, stimulate improved joint nutrition, and prepare body structures and systems for following interventions. x 5  minutes. Average SPM = 80.  Circuit: - Seated lat pull, 3x15 with 30#. - plank to alternating side plank at 22.5  inch plinth from elbows to toes, 3x10 each direction.   - standing hip hinge to touch hands on 17 inch chair with knees up against front of chair, 1x4. VC/TC for hip hinge   Circuit: - bent over reverse fly exercise with knees against front of chair and fists touching seat of chair, VC/TC for hip hinge and scapular motion. 3x10 with 2#DB. -  standing TRX row, 3x10  Circuit: - standing figure 8 swing with 1kg med ball in pillow case, 3x55 seconds.   - standing squat with buttocks touch on 17 inch chair while holding 5# DB at chest, 3x10    Pt required multimodal cuing for proper technique and to facilitate improved neuromuscular control, strength, range  of motion, and functional ability resulting in improved performance and form.   PATIENT EDUCATION:  Education details: Exercise purpose/form. Self management techniques.  Person educated: Patient Education method: Customer service manager Education comprehension: verbalized understanding and needs further education   HOME EXERCISE PROGRAM: Access Code: [email or web address] URL: [email or web address] Date: 12/29/2021 Prepared by: Rosita Kea  Exercises - Beginner Flat Bicycle  - 3 x weekly - 3 sets - 30 seconds time: - Bird Dog on Counter  - 3 x weekly - 3 sets - 10 reps - Forearm Side Plank on Counter  - 3 x weekly - 3 sets - 30 seconds hold  Ahtanum Schroth Sitting at [place] with poles -   ASSESSMENT:  CLINICAL IMPRESSION: Patient tolerated treatment well overall and did not have pain except with mild increase in achiness after bent over reverse fly exercise. Patient continues to tolerate progressively more difficult core and functional exercises but is still limited in her daily activities by pain. Patient would benefit from continued management of limiting condition by skilled physical therapist to address remaining impairments and functional limitations to work towards stated goals and return to PLOF or maximal functional independence.   Patient is a 66 y.o. female referred to outpatient physical therapy with a medical diagnosis of other secondary scoliosis, lumbar region who presents with signs and symptoms consistent with acute on chronic bilateral low back pain with intermittent radiation to right LE below the knee in the context of  levoscoliosis mainly at the lumbar region. Her referring provider has recommended major surgery later in the year for her low back.  Patient presents with significant pain, ROM, posture, joint stiffness, muscle tension, motor control, muscle performance (strength/power/endurance) and activity tolerance impairments that are limiting ability to complete her usual activities such as prolonged positions, walking, sitting, working, hobbies, lifting, moving to a new house, going up and down stairs, etc  without difficulty and decreases her quality of life. Patient will benefit from skilled physical therapy intervention to address current body structure impairments and activity limitations to improve function and work towards goals set in current POC in order to return to prior level of function or maximal functional improvement.   OBJECTIVE IMPAIRMENTS decreased activity tolerance, decreased balance, decreased endurance, decreased mobility, difficulty walking, decreased ROM, decreased strength, hypomobility, impaired perceived functional ability, increased muscle spasms, impaired flexibility, impaired sensation, postural dysfunction, and pain.   ACTIVITY LIMITATIONS community activity, occupation, and difficulty with prolonged positions, walking, sitting, working, hobbies, lifting, moving to a new house, going up and down stairs, etc.    PERSONAL FACTORS Age, Past/current experiences, Time since onset of injury/illness/exacerbation, and 3+ comorbidities: reactive airway disease, HTN, GERD, hx of cervical myelopathy with gait disruption (ACDF 04/22/2022 at C4-5), osteopenia, pure hypercholesterolemia, hypothyroidism, unexplained dropping things and stumbling are also affecting patient's functional outcome.    REHAB POTENTIAL: Good  CLINICAL DECISION MAKING: Evolving/moderate complexity  EVALUATION COMPLEXITY: Moderate   GOALS: Goals reviewed with patient? No  SHORT TERM GOALS: Target date:  11/01/2021  Patient will be independent with initial home exercise program for self-management of symptoms. Baseline: Initial HEP to be provided at visit 2 as appropriate (10/18/21); Initial HEP provided visit 2 (10/20/2021);  Goal status: Achieved   LONG TERM GOALS: Target date: 01/10/2022; updated to 03/21/2022 for all unmet goals on 12/29/2021  Patient will be independent with a long-term home exercise program for self-management of symptoms.  Baseline: Initial HEP to be provided at visit 2 as appropriate (10/18/21); initial HEP  provided visit 2 (10/20/2021); currently participating appropriately (11/17/2021; 12/29/2021);  Goal status: In progress  2.  Patient will demonstrate improved FOTO to equal or greater than 62 by visit #13 to demonstrate improvement in overall condition and self-reported functional ability.  Baseline: 50 (10/18/21); 53 at visit #5 (10/31/2021); 42 at visit #10 (11/17/2021); 52 at visit #20 (12/29/2021);  Goal status: ongoing   3.  Reduce pain with functional activities to equal or less than 1/10 to allow patient to complete usual activities including housework, walking, bending with less difficulty.   Baseline: up to 9/10 (10/18/21); up to 8/10 (11/17/2021); up to 5/10 (12/29/2021);  Goal status: IN PROGRESS  4.  Patient will demonstrate right hip strength equal or greater than left hip strength without increase in symptoms to improve ability to perform functional activities such as navigating stairs, walking, and lifting with less difficulty.  Baseline: 1/2 MMT grade lower on R than left and symptomatic in some motions - see objective exam (10/18/21); improved strength in some motions and not painful in any motion  - see objective (11/17/2021); nearly met - see objective (12/29/2021);  Goal status: Nearly met  5.  Patient will complete community, work and/or recreational activities without limitation due to current condition.  Baseline: difficulty with prolonged positions,  walking, sitting, working, hobbies, lifting, moving to a new house, going up and down stairs, etc.  (10/18/21); continues to have difficulty with these activities at about the same rate (11/17/2021); Patient reports all of these things have gotten better, but she is still limited by how long she can stand and walk. She rates her improvement since starting PT at approximately 50% improved (12/29/2021);  Goal status: In-progress.     PLAN: PT FREQUENCY: 1-2x/week  PT DURATION: 12 weeks  PLANNED INTERVENTIONS: Therapeutic exercises, Therapeutic activity, Neuromuscular re-education, Balance training, Patient/Family education, Joint mobilization, Dry Needling, Electrical stimulation, Spinal mobilization, Cryotherapy, Moist heat, and Manual therapy.  PLAN FOR NEXT SESSION: update HEP as appropriate, progressive, core, hip and functional strengthening, manual therapy as needed   Everlean Alstrom. Graylon Good, PT, DPT 01/16/22, 7:04 PM  [facility] [address] [place],  [address] P: [phone] I F: [phone]

## 2022-01-25 ENCOUNTER — Ambulatory Visit: Payer: Medicare Other | Admitting: Physical Therapy

## 2022-01-25 ENCOUNTER — Encounter: Payer: Self-pay | Admitting: Physical Therapy

## 2022-01-25 DIAGNOSIS — M5459 Other low back pain: Secondary | ICD-10-CM

## 2022-01-25 DIAGNOSIS — M25551 Pain in right hip: Secondary | ICD-10-CM

## 2022-01-25 DIAGNOSIS — R262 Difficulty in walking, not elsewhere classified: Secondary | ICD-10-CM

## 2022-01-25 DIAGNOSIS — M5416 Radiculopathy, lumbar region: Secondary | ICD-10-CM

## 2022-01-25 NOTE — Therapy (Signed)
OUTPATIENT PHYSICAL THERAPY TREATMENT NOTE   Patient Name: Kathryn Whitehead MRN: [id number] DOB:09/10/1955, 66 y.o., female Today's Date: 01/25/2022   PT End of Session - 01/25/22 1859     Visit Number 24    Number of Visits 82    Date for PT Re-Evaluation 03/23/22    Authorization Type MEDICARE PART B reporting period from 12/29/2021    Progress Note Due on Visit 16    PT Start Time 1850    PT Stop Time 1900    PT Time Calculation (min) 10 min    Activity Tolerance Patient tolerated treatment well    Behavior During Therapy WFL for tasks assessed/performed                 Past Medical History:  Diagnosis Date   Degenerative disc disease, lumbar    Difficult intubation    GERD (gastroesophageal reflux disease)    Hypercholesterolemia    Hypertension    Hypothyroidism    Late latent syphilis    Osteopenia    Reactive airway disease, moderate persistent, uncomplicated    Sinus bradycardia    Thyroid disease    Past Surgical History:  Procedure Laterality Date   ANTERIOR CERVICAL DECOMP/DISCECTOMY FUSION N/A 04/23/2019   Procedure: ANTERIOR CERVICAL DECOMPRESSION/DISCECTOMY FUSION 1 LEVEL C4-5;  Surgeon: Meade Maw, MD;  Location: [facility];  Service: Neurosurgery;  Laterality: N/A;   BACK SURGERY  1994   herniated disc removed lumbar area [facility])   [place]   COLONOSCOPY     COLONOSCOPY WITH PROPOFOL N/A 04/29/2021   Procedure: COLONOSCOPY WITH PROPOFOL;  Surgeon: Annamaria Helling, DO;  Location: [place];  Service: Gastroenterology;  Laterality: N/A;   TONSILLECTOMY     TONSILLECTOMY AND ADENOIDECTOMY     teenager   TUBAL LIGATION     Patient Active Problem List   Diagnosis Date Noted   Cervical myelopathy ([place]) 04/23/2019   Osteopenia of multiple sites 10/23/2018   Late latent syphilis 01/08/2018   Sinus bradycardia by electrocardiogram 11/07/2017   RAD (reactive airway disease), moderate persistent, uncomplicated  56/38/9373   Chronic midline low back pain without sciatica 09/05/2016   Hot flashes, menopausal 09/05/2016   Mid back pain on left side 09/05/2016   Postmenopausal 03/30/2016   Gastroesophageal reflux disease without esophagitis 12/24/2014   Degenerative disc disease, lumbar 12/24/2014   Acquired hypothyroidism 09/12/2014   Benign essential hypertension 09/12/2014   Pure hypercholesterolemia 09/12/2014    PCP: Marinda Elk, MD  REFERRING PROVIDER: Starling Manns, MD  REFERRING DIAG: secondary scoliosis, lumbar region   THERAPY DIAG:  Other low back pain  Radiculopathy, lumbar region  Pain in right hip  Difficulty in walking, not elsewhere classified  Rationale for Evaluation and Treatment: Rehabilitation  ONSET DATE: chronic from about 1994, but several months ago got worse  PERTINENT HISTORY: Patient is a 66 y.o. female who presents to outpatient physical therapy with a referral for medical diagnosis other secondary scoliosis, lumbar region. This patient's chief complaints consist of low back pain with radiation to right LE knee and medial calf leading to the following functional deficits: difficulty with prolonged positions, walking, sitting, working, hobbies, lifting, moving to a new house, going up and down stairs, etc. Relevant past medical history and comorbidities include reactive airway disease, HTN, GERD, hx of cervical myelopathy with gait disruption (ACDF 04/22/2022 at C4-5), osteopenia, pure hypercholesterolemia, hypothyroidism, unexplained dropping things and stumbling, latex sensitivity.  Patient denies hx of cancer, stroke,  seizures, heart problems, diabetes, unexplained weight loss, and unexplained changes in bowel or bladder problems. She has had some unexplained dropping things. She is planniing to have major surgery in her low back (possibly at mid thoracic to sacral fusion?) in November 2023.   PRECAUTIONS: none  PATIENT GOALS: "to try to reduce some of  the pain"  SUBJECTIVE:  Patient reports her back is really bothering her today. She is wearing a lumbar support and she states her pain has been up to 8/10 today. On her ride over here her right knee got really numb and achy. She awoke with more pain but it seemed to really get worse after lunch while she was sitting at her desk. She went home and laid down and that's when it hurt up to 8/10 when she tried to get up. She state she did her HEP yesterday with no trouble and nothing unusual. She has her surgery scheduled. She has a pre-op office visit on 8/4 at 11am, her first surgery is on 02/27/2022 and her second surgery is on 03/27/2022.    PAIN:  Are you having pain?  Yes, 3/10 across low back    OBJECTIVE                                                                                                                                                                                    TODAY'S TREATMENT  Therapeutic exercise: to centralize symptoms and improve ROM, strength, muscular endurance, and activity tolerance required for successful completion of functional activities.  - NuStep level 6 using bilateral upper and lower extremities. Seat/handle setting 6/8. For improved extremity mobility, muscular endurance, and activity tolerance; and to induce the analgesic effect of aerobic exercise, stimulate improved joint nutrition, and prepare body structures and systems for following interventions. x 5  minutes. Average SPM = 66  (Manual therapy - see below)  - prone alternating hip extension with abdominal brace, 3x10 each side, pillow under abdomen, face in table cradle.  - education on log roll technique for supine <> sit - Seated lat pull, 3x15 with 30#. - standing TRX row, 3x10  Manual therapy: to reduce pain and tissue tension, improve range of motion, neuromodulation, in order to promote improved ability to complete functional activities. PRONE with head in table cradle, pillow under abdomen  and ankles.  - STM to bilateral lumbar and lower thoracic paraspinals and quadratus lumborum.     Pt required multimodal cuing for proper technique and to facilitate improved neuromuscular control, strength, range of motion, and functional ability resulting in improved performance and form.   PATIENT EDUCATION:  Education details: Exercise purpose/form. Self management techniques.  Person educated:  Patient Education method: Explanation and Demonstration Education comprehension: verbalized understanding and needs further education   HOME EXERCISE PROGRAM: Access Code: [email or web address] URL: [email or web address] Date: 12/29/2021 Prepared by: Rosita Kea  Exercises - Beginner Flat Bicycle  - 3 x weekly - 3 sets - 30 seconds time: - Bird Dog on Counter  - 3 x weekly - 3 sets - 10 reps - Forearm Side Plank on Counter  - 3 x weekly - 3 sets - 30 seconds hold  [place] Sitting at [place] with poles -   ASSESSMENT:  CLINICAL IMPRESSION: Patient arrives with elevated pain so interventions modified to accommodate increased pain and provide pain relief. Patient tolerated them well and reported decreased pain by end of session. Patient's first surgery is scheduled for 02/27/22 and plan to continue PT 1x a week until that time to continue to improve patient's core strength in preparation of surgery.Patient would benefit from continued management of limiting condition by skilled physical therapist to address remaining impairments and functional limitations to work towards stated goals and return to PLOF or maximal functional independence.   Patient is a 66 y.o. female referred to outpatient physical therapy with a medical diagnosis of other secondary scoliosis, lumbar region who presents with signs and symptoms consistent with acute on chronic bilateral low back pain with intermittent radiation to right LE below the knee in the context of levoscoliosis mainly at the  lumbar region. Her referring provider has recommended major surgery later in the year for her low back.  Patient presents with significant pain, ROM, posture, joint stiffness, muscle tension, motor control, muscle performance (strength/power/endurance) and activity tolerance impairments that are limiting ability to complete her usual activities such as prolonged positions, walking, sitting, working, hobbies, lifting, moving to a new house, going up and down stairs, etc  without difficulty and decreases her quality of life. Patient will benefit from skilled physical therapy intervention to address current body structure impairments and activity limitations to improve function and work towards goals set in current POC in order to return to prior level of function or maximal functional improvement.   OBJECTIVE IMPAIRMENTS decreased activity tolerance, decreased balance, decreased endurance, decreased mobility, difficulty walking, decreased ROM, decreased strength, hypomobility, impaired perceived functional ability, increased muscle spasms, impaired flexibility, impaired sensation, postural dysfunction, and pain.   ACTIVITY LIMITATIONS community activity, occupation, and difficulty with prolonged positions, walking, sitting, working, hobbies, lifting, moving to a new house, going up and down stairs, etc.    PERSONAL FACTORS Age, Past/current experiences, Time since onset of injury/illness/exacerbation, and 3+ comorbidities: reactive airway disease, HTN, GERD, hx of cervical myelopathy with gait disruption (ACDF 04/22/2022 at C4-5), osteopenia, pure hypercholesterolemia, hypothyroidism, unexplained dropping things and stumbling are also affecting patient's functional outcome.    REHAB POTENTIAL: Good  CLINICAL DECISION MAKING: Evolving/moderate complexity  EVALUATION COMPLEXITY: Moderate   GOALS: Goals reviewed with patient? No  SHORT TERM GOALS: Target date: 11/01/2021  Patient will be independent  with initial home exercise program for self-management of symptoms. Baseline: Initial HEP to be provided at visit 2 as appropriate (10/18/21); Initial HEP provided visit 2 (10/20/2021);  Goal status: Achieved   LONG TERM GOALS: Target date: 01/10/2022; updated to 03/21/2022 for all unmet goals on 12/29/2021  Patient will be independent with a long-term home exercise program for self-management of symptoms.  Baseline: Initial HEP to be provided at visit 2 as appropriate (10/18/21); initial HEP provided visit 2 (10/20/2021); currently participating appropriately (11/17/2021; 12/29/2021);  Goal status: In  progress  2.  Patient will demonstrate improved FOTO to equal or greater than 62 by visit #13 to demonstrate improvement in overall condition and self-reported functional ability.  Baseline: 50 (10/18/21); 53 at visit #5 (10/31/2021); 42 at visit #10 (11/17/2021); 52 at visit #20 (12/29/2021);  Goal status: ongoing   3.  Reduce pain with functional activities to equal or less than 1/10 to allow patient to complete usual activities including housework, walking, bending with less difficulty.   Baseline: up to 9/10 (10/18/21); up to 8/10 (11/17/2021); up to 5/10 (12/29/2021);  Goal status: IN PROGRESS  4.  Patient will demonstrate right hip strength equal or greater than left hip strength without increase in symptoms to improve ability to perform functional activities such as navigating stairs, walking, and lifting with less difficulty.  Baseline: 1/2 MMT grade lower on R than left and symptomatic in some motions - see objective exam (10/18/21); improved strength in some motions and not painful in any motion  - see objective (11/17/2021); nearly met - see objective (12/29/2021);  Goal status: Nearly met  5.  Patient will complete community, work and/or recreational activities without limitation due to current condition.  Baseline: difficulty with prolonged positions, walking, sitting, working, hobbies, lifting,  moving to a new house, going up and down stairs, etc.  (10/18/21); continues to have difficulty with these activities at about the same rate (11/17/2021); Patient reports all of these things have gotten better, but she is still limited by how long she can stand and walk. She rates her improvement since starting PT at approximately 50% improved (12/29/2021);  Goal status: In-progress.     PLAN: PT FREQUENCY: 1-2x/week  PT DURATION: 12 weeks  PLANNED INTERVENTIONS: Therapeutic exercises, Therapeutic activity, Neuromuscular re-education, Balance training, Patient/Family education, Joint mobilization, Dry Needling, Electrical stimulation, Spinal mobilization, Cryotherapy, Moist heat, and Manual therapy.  PLAN FOR NEXT SESSION: update HEP as appropriate, progressive, core, hip and functional strengthening, manual therapy as needed   Everlean Alstrom. Graylon Good, PT, DPT 01/25/22, 7:01 PM  [facility] [address] [place], [place] [address] P: [phone] I F: [phone]

## 2022-01-31 ENCOUNTER — Encounter: Payer: Self-pay | Admitting: Physical Therapy

## 2022-01-31 ENCOUNTER — Ambulatory Visit: Payer: Medicare Other | Admitting: Physical Therapy

## 2022-01-31 DIAGNOSIS — M25551 Pain in right hip: Secondary | ICD-10-CM

## 2022-01-31 DIAGNOSIS — R262 Difficulty in walking, not elsewhere classified: Secondary | ICD-10-CM

## 2022-01-31 DIAGNOSIS — M5459 Other low back pain: Secondary | ICD-10-CM | POA: Diagnosis not present

## 2022-01-31 DIAGNOSIS — M5416 Radiculopathy, lumbar region: Secondary | ICD-10-CM

## 2022-01-31 NOTE — Therapy (Signed)
OUTPATIENT PHYSICAL THERAPY TREATMENT NOTE   Patient Name: Kathryn Whitehead MRN: [id number] DOB:[birth year], 66 y.o., female Today's Date: 01/31/2022   PT End of Session - 01/31/22 1729     Visit Number 25    Number of Visits 76    Date for PT Re-Evaluation 03/23/22    Authorization Type MEDICARE PART B reporting period from 12/29/2021    Progress Note Due on Visit 47    PT Start Time 1733    PT Stop Time 1811    PT Time Calculation (min) 38 min    Activity Tolerance Patient tolerated treatment well    Behavior During Therapy WFL for tasks assessed/performed                  Past Medical History:  Diagnosis Date   Degenerative disc disease, lumbar    Difficult intubation    GERD (gastroesophageal reflux disease)    Hypercholesterolemia    Hypertension    Hypothyroidism    Late latent syphilis    Osteopenia    Reactive airway disease, moderate persistent, uncomplicated    Sinus bradycardia    Thyroid disease    Past Surgical History:  Procedure Laterality Date   ANTERIOR CERVICAL DECOMP/DISCECTOMY FUSION N/A 04/23/2019   Procedure: ANTERIOR CERVICAL DECOMPRESSION/DISCECTOMY FUSION 1 LEVEL C4-5;  Surgeon: Meade Maw, MD;  Location: [facility];  Service: Neurosurgery;  Laterality: N/A;   BACK SURGERY  1994   herniated disc removed lumbar area [facility])   [place]   COLONOSCOPY     COLONOSCOPY WITH PROPOFOL N/A 04/29/2021   Procedure: COLONOSCOPY WITH PROPOFOL;  Surgeon: Annamaria Helling, DO;  Location: [place];  Service: Gastroenterology;  Laterality: N/A;   TONSILLECTOMY     TONSILLECTOMY AND ADENOIDECTOMY     teenager   TUBAL LIGATION     Patient Active Problem List   Diagnosis Date Noted   Cervical myelopathy ([place]) 04/23/2019   Osteopenia of multiple sites 10/23/2018   Late latent syphilis 01/08/2018   Sinus bradycardia by electrocardiogram 11/07/2017   RAD (reactive airway disease), moderate persistent, uncomplicated  86/57/8469   Chronic midline low back pain without sciatica 09/05/2016   Hot flashes, menopausal 09/05/2016   Mid back pain on left side 09/05/2016   Postmenopausal 03/30/2016   Gastroesophageal reflux disease without esophagitis 12/24/2014   Degenerative disc disease, lumbar 12/24/2014   Acquired hypothyroidism 09/12/2014   Benign essential hypertension 09/12/2014   Pure hypercholesterolemia 09/12/2014    PCP: Marinda Elk, MD  REFERRING PROVIDER: Starling Manns, MD  REFERRING DIAG: secondary scoliosis, lumbar region   THERAPY DIAG:  Other low back pain  Radiculopathy, lumbar region  Pain in right hip  Difficulty in walking, not elsewhere classified  Rationale for Evaluation and Treatment: Rehabilitation  ONSET DATE: chronic from about 1994, but several months ago got worse  PERTINENT HISTORY: Patient is a 66 y.o. female who presents to outpatient physical therapy with a referral for medical diagnosis other secondary scoliosis, lumbar region. This patient's chief complaints consist of low back pain with radiation to right LE knee and medial calf leading to the following functional deficits: difficulty with prolonged positions, walking, sitting, working, hobbies, lifting, moving to a new house, going up and down stairs, etc. Relevant past medical history and comorbidities include reactive airway disease, HTN, GERD, hx of cervical myelopathy with gait disruption (ACDF 04/22/2022 at C4-5), osteopenia, pure hypercholesterolemia, hypothyroidism, unexplained dropping things and stumbling, latex sensitivity.  Patient denies hx of cancer,  stroke, seizures, heart problems, diabetes, unexplained weight loss, and unexplained changes in bowel or bladder problems. She has had some unexplained dropping things. She is planniing to have major surgery in her low back (possibly at mid thoracic to sacral fusion?) in November 2023.   PRECAUTIONS: none  PATIENT GOALS: "to try to reduce some of  the pain"  SUBJECTIVE:  Patient reports her pain has been pretty mild today but her R knee got really numb while sitting in the waiting room. She cannot remember when it started feeling better after she came to last PT session after a day of more pain. She has a pre-op office visit on 8/4 at 11am, her first surgery is on 02/27/2022 and her second surgery is on 03/27/2022. She states her HEP is going well and she did them all yesterday.   PAIN:  Are you having pain?  Yes, 3/10 across low back and right knee.     OBJECTIVE                                                                                                                                                                                    TODAY'S TREATMENT  Therapeutic exercise: to centralize symptoms and improve ROM, strength, muscular endurance, and activity tolerance required for successful completion of functional activities.  - NuStep level 6 using bilateral upper and lower extremities. Seat/handle setting 6/8. For improved extremity mobility, muscular endurance, and activity tolerance; and to induce the analgesic effect of aerobic exercise, stimulate improved joint nutrition, and prepare body structures and systems for following interventions. x 5  minutes. Average SPM = 78.  Circuit: - Seated lat pull, 3x15 with 30#. - plank to alternating side plank at 31.5 inch plinth from elbows to toes, 3x10 each direction.   - standing hip hinge to touch hands on 17 inch chair with knees up against front of chair, 1x10. VC/TC for hip hinge (pain in left glute region afterwards).    Circuit: - standing TRX row, 3x15 - standing squat with buttocks touch on 17 inch chair while holding 5# DB at chest, 3x10  - standing figure 8 swing with 1kg med ball in pillow case, 3x60 seconds.  - seated pallof marching while sitting on theraball with SBA, 2x10 each side facing each direction.     Pt required multimodal cuing for proper technique and to  facilitate improved neuromuscular control, strength, range of motion, and functional ability resulting in improved performance and form.   PATIENT EDUCATION:  Education details: Exercise purpose/form. Self management techniques.  Person educated: Patient Education method: Customer service manager Education comprehension: verbalized understanding and needs further education   HOME EXERCISE PROGRAM:  Access Code: [email or web address] URL: [email or web address] Date: 12/29/2021 Prepared by: Rosita Kea  Exercises - Beginner Flat Bicycle  - 3 x weekly - 3 sets - 30 seconds time: - Bird Dog on Counter  - 3 x weekly - 3 sets - 10 reps - Forearm Side Plank on Counter  - 3 x weekly - 3 sets - 30 seconds hold  [place] Schroth Sitting at [place] with poles -   ASSESSMENT:  CLINICAL IMPRESSION: Patient arrives with pain back to usual levels and was able to progress back to similar exercises completed 2 weeks ago before she experienced the flair in pain. She did need some seated rest breaks due to left sided back pain but R knee numbness resolved at the start of the session and did not return after. Patient would benefit from continued management of limiting condition by skilled physical therapist to address remaining impairments and functional limitations to work towards stated goals and return to PLOF or maximal functional independence.   Patient is a 66 y.o. female referred to outpatient physical therapy with a medical diagnosis of other secondary scoliosis, lumbar region who presents with signs and symptoms consistent with acute on chronic bilateral low back pain with intermittent radiation to right LE below the knee in the context of levoscoliosis mainly at the lumbar region. Her referring provider has recommended major surgery later in the year for her low back.  Patient presents with significant pain, ROM, posture, joint stiffness, muscle tension, motor control,  muscle performance (strength/power/endurance) and activity tolerance impairments that are limiting ability to complete her usual activities such as prolonged positions, walking, sitting, working, hobbies, lifting, moving to a new house, going up and down stairs, etc  without difficulty and decreases her quality of life. Patient will benefit from skilled physical therapy intervention to address current body structure impairments and activity limitations to improve function and work towards goals set in current POC in order to return to prior level of function or maximal functional improvement.   OBJECTIVE IMPAIRMENTS decreased activity tolerance, decreased balance, decreased endurance, decreased mobility, difficulty walking, decreased ROM, decreased strength, hypomobility, impaired perceived functional ability, increased muscle spasms, impaired flexibility, impaired sensation, postural dysfunction, and pain.   ACTIVITY LIMITATIONS community activity, occupation, and difficulty with prolonged positions, walking, sitting, working, hobbies, lifting, moving to a new house, going up and down stairs, etc.    PERSONAL FACTORS Age, Past/current experiences, Time since onset of injury/illness/exacerbation, and 3+ comorbidities: reactive airway disease, HTN, GERD, hx of cervical myelopathy with gait disruption (ACDF 04/22/2022 at C4-5), osteopenia, pure hypercholesterolemia, hypothyroidism, unexplained dropping things and stumbling are also affecting patient's functional outcome.    REHAB POTENTIAL: Good  CLINICAL DECISION MAKING: Evolving/moderate complexity  EVALUATION COMPLEXITY: Moderate   GOALS: Goals reviewed with patient? No  SHORT TERM GOALS: Target date: 11/01/2021  Patient will be independent with initial home exercise program for self-management of symptoms. Baseline: Initial HEP to be provided at visit 2 as appropriate (10/18/21); Initial HEP provided visit 2 (10/20/2021);  Goal status:  Achieved   LONG TERM GOALS: Target date: 01/10/2022; updated to 03/21/2022 for all unmet goals on 12/29/2021  Patient will be independent with a long-term home exercise program for self-management of symptoms.  Baseline: Initial HEP to be provided at visit 2 as appropriate (10/18/21); initial HEP provided visit 2 (10/20/2021); currently participating appropriately (11/17/2021; 12/29/2021);  Goal status: In progress  2.  Patient will demonstrate improved FOTO to equal or greater than 62 by visit #13  to demonstrate improvement in overall condition and self-reported functional ability.  Baseline: 50 (10/18/21); 53 at visit #5 (10/31/2021); 42 at visit #10 (11/17/2021); 52 at visit #20 (12/29/2021);  Goal status: ongoing   3.  Reduce pain with functional activities to equal or less than 1/10 to allow patient to complete usual activities including housework, walking, bending with less difficulty.   Baseline: up to 9/10 (10/18/21); up to 8/10 (11/17/2021); up to 5/10 (12/29/2021);  Goal status: IN PROGRESS  4.  Patient will demonstrate right hip strength equal or greater than left hip strength without increase in symptoms to improve ability to perform functional activities such as navigating stairs, walking, and lifting with less difficulty.  Baseline: 1/2 MMT grade lower on R than left and symptomatic in some motions - see objective exam (10/18/21); improved strength in some motions and not painful in any motion  - see objective (11/17/2021); nearly met - see objective (12/29/2021);  Goal status: Nearly met  5.  Patient will complete community, work and/or recreational activities without limitation due to current condition.  Baseline: difficulty with prolonged positions, walking, sitting, working, hobbies, lifting, moving to a new house, going up and down stairs, etc.  (10/18/21); continues to have difficulty with these activities at about the same rate (11/17/2021); Patient reports all of these things have gotten  better, but she is still limited by how long she can stand and walk. She rates her improvement since starting PT at approximately 50% improved (12/29/2021);  Goal status: In-progress.     PLAN: PT FREQUENCY: 1-2x/week  PT DURATION: 12 weeks  PLANNED INTERVENTIONS: Therapeutic exercises, Therapeutic activity, Neuromuscular re-education, Balance training, Patient/Family education, Joint mobilization, Dry Needling, Electrical stimulation, Spinal mobilization, Cryotherapy, Moist heat, and Manual therapy.  PLAN FOR NEXT SESSION: update HEP as appropriate, progressive, core, hip and functional strengthening, manual therapy as needed   Everlean Alstrom. Graylon Good, PT, DPT 01/31/22, 7:18 PM  [facility] [address] [place], [place] [address] P: [phone] I F: [phone]

## 2022-02-07 ENCOUNTER — Encounter: Payer: Self-pay | Admitting: Physical Therapy

## 2022-02-07 ENCOUNTER — Ambulatory Visit: Payer: Medicare Other | Attending: Orthopaedic Surgery

## 2022-02-07 DIAGNOSIS — M25551 Pain in right hip: Secondary | ICD-10-CM | POA: Insufficient documentation

## 2022-02-07 DIAGNOSIS — M5416 Radiculopathy, lumbar region: Secondary | ICD-10-CM | POA: Insufficient documentation

## 2022-02-07 DIAGNOSIS — R262 Difficulty in walking, not elsewhere classified: Secondary | ICD-10-CM | POA: Diagnosis present

## 2022-02-07 DIAGNOSIS — M5459 Other low back pain: Secondary | ICD-10-CM | POA: Insufficient documentation

## 2022-02-07 NOTE — Therapy (Signed)
OUTPATIENT PHYSICAL THERAPY TREATMENT NOTE   Patient Name: Kathryn Whitehead MRN: [id number] DOB:04-Aug-1955, 66 y.o., female Today's Date: 02/07/2022   PT End of Session - 02/07/22 1713     Visit Number 26    Number of Visits 39    Date for PT Re-Evaluation 03/23/22    Authorization Type MEDICARE PART B reporting period from 12/29/2021    Progress Note Due on Visit 30    PT Start Time 1715    PT Stop Time 1800    PT Time Calculation (min) 45 min    Activity Tolerance Patient tolerated treatment well    Behavior During Therapy WFL for tasks assessed/performed                  Past Medical History:  Diagnosis Date   Degenerative disc disease, lumbar    Difficult intubation    GERD (gastroesophageal reflux disease)    Hypercholesterolemia    Hypertension    Hypothyroidism    Late latent syphilis    Osteopenia    Reactive airway disease, moderate persistent, uncomplicated    Sinus bradycardia    Thyroid disease    Past Surgical History:  Procedure Laterality Date   ANTERIOR CERVICAL DECOMP/DISCECTOMY FUSION N/A 04/23/2019   Procedure: ANTERIOR CERVICAL DECOMPRESSION/DISCECTOMY FUSION 1 LEVEL C4-5;  Surgeon: Meade Maw, MD;  Location: [facility];  Service: Neurosurgery;  Laterality: N/A;   BACK SURGERY  1994   herniated disc removed lumbar area [facility])   [place]   COLONOSCOPY     COLONOSCOPY WITH PROPOFOL N/A 04/29/2021   Procedure: COLONOSCOPY WITH PROPOFOL;  Surgeon: Annamaria Helling, DO;  Location: [place];  Service: Gastroenterology;  Laterality: N/A;   TONSILLECTOMY     TONSILLECTOMY AND ADENOIDECTOMY     teenager   TUBAL LIGATION     Patient Active Problem List   Diagnosis Date Noted   Cervical myelopathy ([place]) 04/23/2019   Osteopenia of multiple sites 10/23/2018   Late latent syphilis 01/08/2018   Sinus bradycardia by electrocardiogram 11/07/2017   RAD (reactive airway disease), moderate persistent, uncomplicated  37/16/9678   Chronic midline low back pain without sciatica 09/05/2016   Hot flashes, menopausal 09/05/2016   Mid back pain on left side 09/05/2016   Postmenopausal 03/30/2016   Gastroesophageal reflux disease without esophagitis 12/24/2014   Degenerative disc disease, lumbar 12/24/2014   Acquired hypothyroidism 09/12/2014   Benign essential hypertension 09/12/2014   Pure hypercholesterolemia 09/12/2014    PCP: Marinda Elk, MD  REFERRING PROVIDER: Starling Manns, MD  REFERRING DIAG: secondary scoliosis, lumbar region   THERAPY DIAG:  Other low back pain  Radiculopathy, lumbar region  Pain in right hip  Difficulty in walking, not elsewhere classified  Rationale for Evaluation and Treatment: Rehabilitation  ONSET DATE: chronic from about 1994, but several months ago got worse  PERTINENT HISTORY: Patient is a 66 y.o. female who presents to outpatient physical therapy with a referral for medical diagnosis other secondary scoliosis, lumbar region. This patient's chief complaints consist of low back pain with radiation to right LE knee and medial calf leading to the following functional deficits: difficulty with prolonged positions, walking, sitting, working, hobbies, lifting, moving to a new house, going up and down stairs, etc. Relevant past medical history and comorbidities include reactive airway disease, HTN, GERD, hx of cervical myelopathy with gait disruption (ACDF 04/22/2022 at C4-5), osteopenia, pure hypercholesterolemia, hypothyroidism, unexplained dropping things and stumbling, latex sensitivity.  Patient denies hx of cancer,  stroke, seizures, heart problems, diabetes, unexplained weight loss, and unexplained changes in bowel or bladder problems. She has had some unexplained dropping things. She is planniing to have major surgery in her low back (possibly at mid thoracic to sacral fusion?) in November 2023.   PRECAUTIONS: none  PATIENT GOALS: "to try to reduce some of  the pain"  SUBJECTIVE:  Pt reports 4/10 pain at work walking on a treadmill for 6 minutes. Pain has returned to 3/10.  PAIN:  Are you having pain?  Yes, 3/10 across low back    OBJECTIVE                                                                                                                                                                                    TODAY'S TREATMENT  02/07/22 There.ex:  Nu Step L6 for 5 minute warm up. UE/LE use   Circuit: - Seated lat pull, 3x10 with 25#. - plank to alternating side plank at 31.5 inch plinth from elbows to toes, 3x10 each direction.    Circuit: - standing TRX row, 3x15 - standing squat with buttocks touch on 17 inch chair while holding 6# DB at chest, 3x10  Circuit  - standing figure 8 swing with 1kg med ball first set and 2 KG med ball on second setin pillow case, 2x60 seconds.  - seated pallof marching while sitting on theraball with SBA, 2x10 each side facing each direction. YTB    Circuit:  - standing D2 flexion PNF with RTB: 1x10/direction. Min to mod multimodal cues for form/technique.  - standing lateral rotation 2 Kg med ball tosses on rebounder 1x10/side  Pt required multimodal cuing for proper technique and to facilitate improved neuromuscular control, strength, range of motion, and functional ability resulting in improved performance and form.   PATIENT EDUCATION:  Education details: Exercise purpose/form. Self management techniques.  Person educated: Patient Education method: Customer service manager Education comprehension: verbalized understanding and needs further education   HOME EXERCISE PROGRAM: Access Code: [email or web address] URL: [email or web address] Date: 12/29/2021 Prepared by: Rosita Kea  Exercises - Beginner Flat Bicycle  - 3 x weekly - 3 sets - 30 seconds time: - Bird Dog on Counter  - 3 x weekly - 3 sets - 10 reps - Forearm Side Plank on Counter  - 3 x weekly - 3 sets - 30 seconds  hold  [place] Schroth Sitting at [place] with poles -   ASSESSMENT:  CLINICAL IMPRESSION: Continuing PT POC with core and back strengthening. Pt remains with LBP in status quo tolerating progressive resistance exercise well with minor worsening of pain with a couple new exercises involving trunk rotation. 2  seated rest breaks throughout due to R knee numbness. At end of session pt reports LBP at baseline with no more R knee numbness. PT to plan on continuous strengthening up until pt's operation for pre surgical prep and maximize functional independence.    Patient is a 66 y.o. female referred to outpatient physical therapy with a medical diagnosis of other secondary scoliosis, lumbar region who presents with signs and symptoms consistent with acute on chronic bilateral low back pain with intermittent radiation to right LE below the knee in the context of levoscoliosis mainly at the lumbar region. Her referring provider has recommended major surgery later in the year for her low back.  Patient presents with significant pain, ROM, posture, joint stiffness, muscle tension, motor control, muscle performance (strength/power/endurance) and activity tolerance impairments that are limiting ability to complete her usual activities such as prolonged positions, walking, sitting, working, hobbies, lifting, moving to a new house, going up and down stairs, etc  without difficulty and decreases her quality of life. Patient will benefit from skilled physical therapy intervention to address current body structure impairments and activity limitations to improve function and work towards goals set in current POC in order to return to prior level of function or maximal functional improvement.   OBJECTIVE IMPAIRMENTS decreased activity tolerance, decreased balance, decreased endurance, decreased mobility, difficulty walking, decreased ROM, decreased strength, hypomobility, impaired perceived functional  ability, increased muscle spasms, impaired flexibility, impaired sensation, postural dysfunction, and pain.   ACTIVITY LIMITATIONS community activity, occupation, and difficulty with prolonged positions, walking, sitting, working, hobbies, lifting, moving to a new house, going up and down stairs, etc.    PERSONAL FACTORS Age, Past/current experiences, Time since onset of injury/illness/exacerbation, and 3+ comorbidities: reactive airway disease, HTN, GERD, hx of cervical myelopathy with gait disruption (ACDF 04/22/2022 at C4-5), osteopenia, pure hypercholesterolemia, hypothyroidism, unexplained dropping things and stumbling are also affecting patient's functional outcome.    REHAB POTENTIAL: Good  CLINICAL DECISION MAKING: Evolving/moderate complexity  EVALUATION COMPLEXITY: Moderate   GOALS: Goals reviewed with patient? No  SHORT TERM GOALS: Target date: 11/01/2021  Patient will be independent with initial home exercise program for self-management of symptoms. Baseline: Initial HEP to be provided at visit 2 as appropriate (10/18/21); Initial HEP provided visit 2 (10/20/2021);  Goal status: Achieved   LONG TERM GOALS: Target date: 01/10/2022; updated to 03/21/2022 for all unmet goals on 12/29/2021  Patient will be independent with a long-term home exercise program for self-management of symptoms.  Baseline: Initial HEP to be provided at visit 2 as appropriate (10/18/21); initial HEP provided visit 2 (10/20/2021); currently participating appropriately (11/17/2021; 12/29/2021);  Goal status: In progress  2.  Patient will demonstrate improved FOTO to equal or greater than 62 by visit #13 to demonstrate improvement in overall condition and self-reported functional ability.  Baseline: 50 (10/18/21); 53 at visit #5 (10/31/2021); 42 at visit #10 (11/17/2021); 52 at visit #20 (12/29/2021);  Goal status: ongoing   3.  Reduce pain with functional activities to equal or less than 1/10 to allow patient to  complete usual activities including housework, walking, bending with less difficulty.   Baseline: up to 9/10 (10/18/21); up to 8/10 (11/17/2021); up to 5/10 (12/29/2021);  Goal status: IN PROGRESS  4.  Patient will demonstrate right hip strength equal or greater than left hip strength without increase in symptoms to improve ability to perform functional activities such as navigating stairs, walking, and lifting with less difficulty.  Baseline: 1/2 MMT grade lower on R than  left and symptomatic in some motions - see objective exam (10/18/21); improved strength in some motions and not painful in any motion  - see objective (11/17/2021); nearly met - see objective (12/29/2021);  Goal status: Nearly met  5.  Patient will complete community, work and/or recreational activities without limitation due to current condition.  Baseline: difficulty with prolonged positions, walking, sitting, working, hobbies, lifting, moving to a new house, going up and down stairs, etc.  (10/18/21); continues to have difficulty with these activities at about the same rate (11/17/2021); Patient reports all of these things have gotten better, but she is still limited by how long she can stand and walk. She rates her improvement since starting PT at approximately 50% improved (12/29/2021);  Goal status: In-progress.     PLAN: PT FREQUENCY: 1-2x/week  PT DURATION: 12 weeks  PLANNED INTERVENTIONS: Therapeutic exercises, Therapeutic activity, Neuromuscular re-education, Balance training, Patient/Family education, Joint mobilization, Dry Needling, Electrical stimulation, Spinal mobilization, Cryotherapy, Moist heat, and Manual therapy.  PLAN FOR NEXT SESSION: update HEP as appropriate, progressive, core, hip and functional strengthening, manual therapy as needed   Salem Caster. Fairly IV, PT, DPT Physical Therapist- [facility]  02/07/22, 6:05 PM  [place] [facility] [address] [place], [place] [address] P: [phone] I F: [phone]

## 2022-02-15 ENCOUNTER — Ambulatory Visit: Payer: Medicare Other | Admitting: Physical Therapy

## 2022-02-15 ENCOUNTER — Encounter: Payer: Self-pay | Admitting: Physical Therapy

## 2022-02-15 DIAGNOSIS — M5416 Radiculopathy, lumbar region: Secondary | ICD-10-CM

## 2022-02-15 DIAGNOSIS — M25551 Pain in right hip: Secondary | ICD-10-CM

## 2022-02-15 DIAGNOSIS — R262 Difficulty in walking, not elsewhere classified: Secondary | ICD-10-CM

## 2022-02-15 DIAGNOSIS — M5459 Other low back pain: Secondary | ICD-10-CM

## 2022-02-15 NOTE — Therapy (Signed)
OUTPATIENT PHYSICAL THERAPY TREATMENT NOTE   Patient Name: Kathryn Whitehead MRN: [id number] DOB:06/30/1956, 66 y.o., female Today's Date: 02/15/2022   PT End of Session - 02/15/22 1921     Visit Number 27    Number of Visits 23    Date for PT Re-Evaluation 03/23/22    Authorization Type MEDICARE PART B reporting period from 12/29/2021    Progress Note Due on Visit 58    PT Start Time 1737    PT Stop Time 1815    PT Time Calculation (min) 38 min    Activity Tolerance Patient tolerated treatment well    Behavior During Therapy WFL for tasks assessed/performed                   Past Medical History:  Diagnosis Date   Degenerative disc disease, lumbar    Difficult intubation    GERD (gastroesophageal reflux disease)    Hypercholesterolemia    Hypertension    Hypothyroidism    Late latent syphilis    Osteopenia    Reactive airway disease, moderate persistent, uncomplicated    Sinus bradycardia    Thyroid disease    Past Surgical History:  Procedure Laterality Date   ANTERIOR CERVICAL DECOMP/DISCECTOMY FUSION N/A 04/23/2019   Procedure: ANTERIOR CERVICAL DECOMPRESSION/DISCECTOMY FUSION 1 LEVEL C4-5;  Surgeon: Meade Maw, MD;  Location: [facility];  Service: Neurosurgery;  Laterality: N/A;   BACK SURGERY  1994   herniated disc removed lumbar area [facility])   [place]   COLONOSCOPY     COLONOSCOPY WITH PROPOFOL N/A 04/29/2021   Procedure: COLONOSCOPY WITH PROPOFOL;  Surgeon: Annamaria Helling, DO;  Location: [place];  Service: Gastroenterology;  Laterality: N/A;   TONSILLECTOMY     TONSILLECTOMY AND ADENOIDECTOMY     teenager   TUBAL LIGATION     Patient Active Problem List   Diagnosis Date Noted   Cervical myelopathy ([place]) 04/23/2019   Osteopenia of multiple sites 10/23/2018   Late latent syphilis 01/08/2018   Sinus bradycardia by electrocardiogram 11/07/2017   RAD (reactive airway disease), moderate persistent, uncomplicated  19/75/8832   Chronic midline low back pain without sciatica 09/05/2016   Hot flashes, menopausal 09/05/2016   Mid back pain on left side 09/05/2016   Postmenopausal 03/30/2016   Gastroesophageal reflux disease without esophagitis 12/24/2014   Degenerative disc disease, lumbar 12/24/2014   Acquired hypothyroidism 09/12/2014   Benign essential hypertension 09/12/2014   Pure hypercholesterolemia 09/12/2014    PCP: Marinda Elk, MD  REFERRING PROVIDER: Starling Manns, MD  REFERRING DIAG: secondary scoliosis, lumbar region   THERAPY DIAG:  Other low back pain  Radiculopathy, lumbar region  Pain in right hip  Difficulty in walking, not elsewhere classified  Rationale for Evaluation and Treatment: Rehabilitation  ONSET DATE: chronic from about 1994, but several months ago got worse  PERTINENT HISTORY: Patient is a 66 y.o. female who presents to outpatient physical therapy with a referral for medical diagnosis other secondary scoliosis, lumbar region. This patient's chief complaints consist of low back pain with radiation to right LE knee and medial calf leading to the following functional deficits: difficulty with prolonged positions, walking, sitting, working, hobbies, lifting, moving to a new house, going up and down stairs, etc. Relevant past medical history and comorbidities include reactive airway disease, HTN, GERD, hx of cervical myelopathy with gait disruption (ACDF 04/22/2022 at C4-5), osteopenia, pure hypercholesterolemia, hypothyroidism, unexplained dropping things and stumbling, latex sensitivity.  Patient denies hx of  cancer, stroke, seizures, heart problems, diabetes, unexplained weight loss, and unexplained changes in bowel or bladder problems. She has had some unexplained dropping things. She is planniing to have major surgery in her low back (possibly at mid thoracic to sacral fusion?) in November 2023.   PRECAUTIONS: none  PATIENT GOALS: "to try to reduce some of  the pain"  SUBJECTIVE:  Patient reports she is feeling okay but her back continues to bother her on and off as usual. She is ready to have her surgery and "get it done with." She was not sore after her last PT session. She did her HEP yesterday and some exercises at work today as well.   PAIN:  Are you having pain?  Yes, 1/10 across low back    OBJECTIVE                                                                                                                                                                                    TODAY'S TREATMENT  Therapeutic exercise: to centralize symptoms and improve ROM, strength, muscular endurance, and activity tolerance required for successful completion of functional activities.  - NuStep level 6 using bilateral upper and lower extremities. Seat/handle setting 6/8. For improved extremity mobility, muscular endurance, and activity tolerance; and to induce the analgesic effect of aerobic exercise, stimulate improved joint nutrition, and prepare body structures and systems for following interventions. x 5  minutes. Average SPM = 86.  Circuit: - standing TRX row, 3x15 - standing squat with buttocks touch on 17 inch chair while holding 3kg med ball held in front of chest, 3x10  Circuit  - standing figure 8 swing with 2 KG med ball on second set in pillow case, 3x60 seconds.  - seated pallof marching while sitting on theraball with SBA, 3x10 each side facing each direction. YTB loop    Circuit: - standing D2 flexion PNF with RTB: 1x10/direction. Min multimodal cues for form/technique.  - Seated lat pull, 2x10 with 30#.  Pt required multimodal cuing for proper technique and to facilitate improved neuromuscular control, strength, range of motion, and functional ability resulting in improved performance and form.   PATIENT EDUCATION:  Education details: Exercise purpose/form. Self management techniques.  Person educated: Patient Education method:  Customer service manager Education comprehension: verbalized understanding and needs further education   HOME EXERCISE PROGRAM: Access Code: [email or web address] URL: [email or web address] Date: 12/29/2021 Prepared by: Rosita Kea  Exercises - Beginner Flat Bicycle  - 3 x weekly - 3 sets - 30 seconds time: - Bird Dog on Counter  - 3 x weekly - 3 sets - 10 reps - Forearm Side Plank on Counter  - 3 x  weekly - 3 sets - 30 seconds hold  Ely Schroth Sitting at [place] with poles -   ASSESSMENT:  CLINICAL IMPRESSION: Patient tolerated treatment well overall with mild increase in low back pain with D2 exercise that resolved with seated lat pull following. Sessions continue to focus on developing core strength and functional activity tolerance in preparation for her upcoming back surgery. Patient continues to need cuing for improved form and load management. Patient would benefit from continued management of limiting condition by skilled physical therapist to address remaining impairments and functional limitations to work towards stated goals and return to PLOF or maximal functional independence.   Patient is a 66 y.o. female referred to outpatient physical therapy with a medical diagnosis of other secondary scoliosis, lumbar region who presents with signs and symptoms consistent with acute on chronic bilateral low back pain with intermittent radiation to right LE below the knee in the context of levoscoliosis mainly at the lumbar region. Her referring provider has recommended major surgery later in the year for her low back.  Patient presents with significant pain, ROM, posture, joint stiffness, muscle tension, motor control, muscle performance (strength/power/endurance) and activity tolerance impairments that are limiting ability to complete her usual activities such as prolonged positions, walking, sitting, working, hobbies, lifting, moving to a new house, going up  and down stairs, etc  without difficulty and decreases her quality of life. Patient will benefit from skilled physical therapy intervention to address current body structure impairments and activity limitations to improve function and work towards goals set in current POC in order to return to prior level of function or maximal functional improvement.   OBJECTIVE IMPAIRMENTS decreased activity tolerance, decreased balance, decreased endurance, decreased mobility, difficulty walking, decreased ROM, decreased strength, hypomobility, impaired perceived functional ability, increased muscle spasms, impaired flexibility, impaired sensation, postural dysfunction, and pain.   ACTIVITY LIMITATIONS community activity, occupation, and difficulty with prolonged positions, walking, sitting, working, hobbies, lifting, moving to a new house, going up and down stairs, etc.    PERSONAL FACTORS Age, Past/current experiences, Time since onset of injury/illness/exacerbation, and 3+ comorbidities: reactive airway disease, HTN, GERD, hx of cervical myelopathy with gait disruption (ACDF 04/22/2022 at C4-5), osteopenia, pure hypercholesterolemia, hypothyroidism, unexplained dropping things and stumbling are also affecting patient's functional outcome.    REHAB POTENTIAL: Good  CLINICAL DECISION MAKING: Evolving/moderate complexity  EVALUATION COMPLEXITY: Moderate   GOALS: Goals reviewed with patient? No  SHORT TERM GOALS: Target date: 11/01/2021  Patient will be independent with initial home exercise program for self-management of symptoms. Baseline: Initial HEP to be provided at visit 2 as appropriate (10/18/21); Initial HEP provided visit 2 (10/20/2021);  Goal status: Achieved   LONG TERM GOALS: Target date: 01/10/2022; updated to 03/21/2022 for all unmet goals on 12/29/2021  Patient will be independent with a long-term home exercise program for self-management of symptoms.  Baseline: Initial HEP to be provided at  visit 2 as appropriate (10/18/21); initial HEP provided visit 2 (10/20/2021); currently participating appropriately (11/17/2021; 12/29/2021);  Goal status: In progress  2.  Patient will demonstrate improved FOTO to equal or greater than 62 by visit #13 to demonstrate improvement in overall condition and self-reported functional ability.  Baseline: 50 (10/18/21); 53 at visit #5 (10/31/2021); 42 at visit #10 (11/17/2021); 52 at visit #20 (12/29/2021);  Goal status: ongoing   3.  Reduce pain with functional activities to equal or less than 1/10 to allow patient to complete usual activities including housework, walking, bending with  less difficulty.   Baseline: up to 9/10 (10/18/21); up to 8/10 (11/17/2021); up to 5/10 (12/29/2021);  Goal status: IN PROGRESS  4.  Patient will demonstrate right hip strength equal or greater than left hip strength without increase in symptoms to improve ability to perform functional activities such as navigating stairs, walking, and lifting with less difficulty.  Baseline: 1/2 MMT grade lower on R than left and symptomatic in some motions - see objective exam (10/18/21); improved strength in some motions and not painful in any motion  - see objective (11/17/2021); nearly met - see objective (12/29/2021);  Goal status: Nearly met  5.  Patient will complete community, work and/or recreational activities without limitation due to current condition.  Baseline: difficulty with prolonged positions, walking, sitting, working, hobbies, lifting, moving to a new house, going up and down stairs, etc.  (10/18/21); continues to have difficulty with these activities at about the same rate (11/17/2021); Patient reports all of these things have gotten better, but she is still limited by how long she can stand and walk. She rates her improvement since starting PT at approximately 50% improved (12/29/2021);  Goal status: In-progress.     PLAN: PT FREQUENCY: 1-2x/week  PT DURATION: 12  weeks  PLANNED INTERVENTIONS: Therapeutic exercises, Therapeutic activity, Neuromuscular re-education, Balance training, Patient/Family education, Joint mobilization, Dry Needling, Electrical stimulation, Spinal mobilization, Cryotherapy, Moist heat, and Manual therapy.  PLAN FOR NEXT SESSION: update HEP as appropriate, progressive, core, hip and functional strengthening, manual therapy as needed   Everlean Alstrom. Graylon Good, PT, DPT 02/15/22, 7:22 PM  [facility] [address] [place], [place] [address] P: [phone] I F: [phone]

## 2022-02-22 ENCOUNTER — Encounter: Payer: Self-pay | Admitting: Physical Therapy

## 2022-02-22 ENCOUNTER — Ambulatory Visit: Payer: Medicare Other | Admitting: Physical Therapy

## 2022-02-22 DIAGNOSIS — M5459 Other low back pain: Secondary | ICD-10-CM

## 2022-02-22 DIAGNOSIS — M25551 Pain in right hip: Secondary | ICD-10-CM

## 2022-02-22 DIAGNOSIS — M5416 Radiculopathy, lumbar region: Secondary | ICD-10-CM

## 2022-02-22 DIAGNOSIS — R262 Difficulty in walking, not elsewhere classified: Secondary | ICD-10-CM

## 2022-02-22 NOTE — Therapy (Signed)
OUTPATIENT PHYSICAL THERAPY TREATMENT NOTE / DISCHARGE SUMMARY Dates of reporting 02/22/2022 to 10/18/2021   Patient Name: Kathryn Whitehead MRN: [id number] DOB:October 07, 1955, 66 y.o., female Today's Date: 02/22/2022   PT End of Session - 02/22/22 1742     Visit Number 28    Number of Visits 43    Date for PT Re-Evaluation 03/23/22    Authorization Type MEDICARE PART B reporting period from 12/29/2021    Progress Note Due on Visit 30    PT Start Time 1737    PT Stop Time 1815    PT Time Calculation (min) 38 min    Activity Tolerance Patient tolerated treatment well    Behavior During Therapy WFL for tasks assessed/performed              Past Medical History:  Diagnosis Date   Degenerative disc disease, lumbar    Difficult intubation    GERD (gastroesophageal reflux disease)    Hypercholesterolemia    Hypertension    Hypothyroidism    Late latent syphilis    Osteopenia    Reactive airway disease, moderate persistent, uncomplicated    Sinus bradycardia    Thyroid disease    Past Surgical History:  Procedure Laterality Date   ANTERIOR CERVICAL DECOMP/DISCECTOMY FUSION N/A 04/23/2019   Procedure: ANTERIOR CERVICAL DECOMPRESSION/DISCECTOMY FUSION 1 LEVEL C4-5;  Surgeon: Venetia Night, MD;  Location: [facility] ORS;  Service: Neurosurgery;  Laterality: N/A;   BACK SURGERY  1994   herniated disc removed lumbar area [facility])   CESAREAN SECTION  1983   COLONOSCOPY     COLONOSCOPY WITH PROPOFOL N/A 04/29/2021   Procedure: COLONOSCOPY WITH PROPOFOL;  Surgeon: Jaynie Collins, DO;  Location: [facility] ENDOSCOPY;  Service: Gastroenterology;  Laterality: N/A;   TONSILLECTOMY     TONSILLECTOMY AND ADENOIDECTOMY     teenager   TUBAL LIGATION     Patient Active Problem List   Diagnosis Date Noted   Cervical myelopathy (HCC) 04/23/2019   Osteopenia of multiple sites 10/23/2018   Late latent syphilis 01/08/2018   Sinus bradycardia by electrocardiogram 11/07/2017   RAD (reactive  airway disease), moderate persistent, uncomplicated 04/04/2017   Chronic midline low back pain without sciatica 09/05/2016   Hot flashes, menopausal 09/05/2016   Mid back pain on left side 09/05/2016   Postmenopausal 03/30/2016   Gastroesophageal reflux disease without esophagitis 12/24/2014   Degenerative disc disease, lumbar 12/24/2014   Acquired hypothyroidism 09/12/2014   Benign essential hypertension 09/12/2014   Pure hypercholesterolemia 09/12/2014    PCP: Patrice Paradise, MD  REFERRING PROVIDER: Patricia Nettle, MD  REFERRING DIAG: secondary scoliosis, lumbar region   THERAPY DIAG:  Other low back pain  Radiculopathy, lumbar region  Pain in right hip  Difficulty in walking, not elsewhere classified  Rationale for Evaluation and Treatment: Rehabilitation  ONSET DATE: chronic from about 1994, but several months ago got worse  PERTINENT HISTORY: Patient is a 66 y.o. female who presents to outpatient physical therapy with a referral for medical diagnosis other secondary scoliosis, lumbar region. This patient's chief complaints consist of low back pain with radiation to right LE knee and medial calf leading to the following functional deficits: difficulty with prolonged positions, walking, sitting, working, hobbies, lifting, moving to a new house, going up and down stairs, etc. Relevant past medical history and comorbidities include reactive airway disease, HTN, GERD, hx of cervical myelopathy with gait disruption (ACDF 04/22/2022 at C4-5), osteopenia, pure hypercholesterolemia, hypothyroidism, unexplained dropping things and stumbling, latex sensitivity.  Patient denies hx of cancer, stroke, seizures, heart problems, diabetes, unexplained weight loss, and unexplained changes in bowel or bladder problems. She has had some unexplained dropping things. She is planniing to have major surgery in her low back (possibly at mid thoracic to sacral fusion?) in November 2023.    PRECAUTIONS: none  PATIENT GOALS: "to try to reduce some of the pain"  SUBJECTIVE:  Patient reports no pain upon arrival. She states she had to stop Meloxicam in preparation for her surgery and she had elevated pain yesterday but today has been better. She has continued to do her HEP. This is her last PT appointment before surgery. She states her doctors just want her to walk a lot after the first surgery.   PAIN:  Are you having pain?  no    OBJECTIVE   SELF-REPORTED FUNCTION FOTO score: 43/100 (lumbar spine questionnaire)   MUSCLE PERFORMANCE (MMT):  *Indicates pain 10/18/21 11/17/21 12/29/21 02/22/22  Joint/Motion R/L R/L R/L R/L  Hip         Flexion (L1, L2) 4+/4+ 5/4+ 5/5 5/5  Extension (knee ext) 4/4+ 4+/4+ 4+/5 5/5  Abduction 3+/4 4+/4 4+/4+ 5/4+  Knee         Extension (L3) 5/5 / /   Flexion (S2) 5/5 / /   Ankle/Foot         Dorsiflexion (L4) 4+/5 / /   Great toe extension (L5) 5/5 / /   Eversion (S1) 5/4+ / /   Plantarflexion (S1) 4+/4+ / /   Comments: muscle cramp at left upper abdominal quadrant with R hip flexion. Cramping at R hip adductors with R hip abduction test.                                                                                                                                                                                      TODAY'S TREATMENT  Therapeutic exercise: to centralize symptoms and improve ROM, strength, muscular endurance, and activity tolerance required for successful completion of functional activities.  - NuStep level 6 using bilateral upper and lower extremities. Seat/handle setting 6/8. For improved extremity mobility, muscular endurance, and activity tolerance; and to induce the analgesic effect of aerobic exercise, stimulate improved joint nutrition, and prepare body structures and systems for following interventions. x 5  minutes. Average SPM = 85.  Circuit: - standing TRX row, 3x15 - standing squat with buttocks touch on 17  inch chair while holding 3kg med ball held in front of chest, 3x10  Circuit  - standing figure 8 swing with 2 KG med ball on second set in pillow case, 3x60 seconds.  - seated pallof marching while sitting  on theraball with SBA, 3x10 each side facing each direction. YTB loop  Pt required multimodal cuing for proper technique and to facilitate improved neuromuscular control, strength, range of motion, and functional ability resulting in improved performance and form.   PATIENT EDUCATION:  Education details: Exercise purpose/form. Self management techniques.  Person educated: Patient Education method: Customer service manager Education comprehension: verbalized understanding and needs further education   HOME EXERCISE PROGRAM: Access Code: [email or web address] URL: [email or web address] Date: 12/29/2021 Prepared by: Rosita Kea  Exercises - Beginner Flat Bicycle  - 3 x weekly - 3 sets - 30 seconds time: - Bird Dog on Counter  - 3 x weekly - 3 sets - 10 reps - Forearm Side Plank on Counter  - 3 x weekly - 3 sets - 30 seconds hold  [place] Sitting at [place] with poles -   ASSESSMENT:  CLINICAL IMPRESSION: Patient has attended 28 physical therapy sessions since starting current episode of care on 10/18/2021. She has met her short term goal, met two long term goals made progress towards her other long term goals except FOTO questionnaire (which has fluctuated). However, PT has not resolved her symptoms and she is discharging today per MD recommendation to have back surgery next week. She has improved her core and functional strength that should improve her tolerance for and recovery after surgery. Patient is now discharged from PT due to planned surgery.   Patient is a 66 y.o. female referred to outpatient physical therapy with a medical diagnosis of other secondary scoliosis, lumbar region who presents with signs and symptoms consistent with acute on  chronic bilateral low back pain with intermittent radiation to right LE below the knee in the context of levoscoliosis mainly at the lumbar region. Her referring provider has recommended major surgery later in the year for her low back.    OBJECTIVE IMPAIRMENTS decreased activity tolerance, decreased balance, decreased endurance, decreased mobility, difficulty walking, decreased ROM, decreased strength, hypomobility, impaired perceived functional ability, increased muscle spasms, impaired flexibility, impaired sensation, postural dysfunction, and pain.   ACTIVITY LIMITATIONS community activity, occupation, and difficulty with prolonged positions, walking, sitting, working, hobbies, lifting, moving to a new house, going up and down stairs, etc.    PERSONAL FACTORS Age, Past/current experiences, Time since onset of injury/illness/exacerbation, and 3+ comorbidities: reactive airway disease, HTN, GERD, hx of cervical myelopathy with gait disruption (ACDF 04/22/2022 at C4-5), osteopenia, pure hypercholesterolemia, hypothyroidism, unexplained dropping things and stumbling are also affecting patient's functional outcome.    REHAB POTENTIAL: Good  CLINICAL DECISION MAKING: Evolving/moderate complexity  EVALUATION COMPLEXITY: Moderate   GOALS: Goals reviewed with patient? No  SHORT TERM GOALS: Target date: 11/01/2021  Patient will be independent with initial home exercise program for self-management of symptoms. Baseline: Initial HEP to be provided at visit 2 as appropriate (10/18/21); Initial HEP provided visit 2 (10/20/2021);  Goal status: Achieved   LONG TERM GOALS: Target date: 01/10/2022; updated to 03/21/2022 for all unmet goals on 12/29/2021  Patient will be independent with a long-term home exercise program for self-management of symptoms.  Baseline: Initial HEP to be provided at visit 2 as appropriate (10/18/21); initial HEP provided visit 2 (10/20/2021); currently participating appropriately  (11/17/2021; 12/29/2021); achieved (02/22/2022);  Goal status: achieved  2.  Patient will demonstrate improved FOTO to equal or greater than 62 by visit #13 to demonstrate improvement in overall condition and self-reported functional ability.  Baseline: 50 (10/18/21); 53 at visit #5 (10/31/2021); 42 at visit #10 (11/17/2021); 52  at visit #20 (12/29/2021); 43 at visit #28 (02/22/2022);  Goal status: ongoing   3.  Reduce pain with functional activities to equal or less than 1/10 to allow patient to complete usual activities including housework, walking, bending with less difficulty.   Baseline: up to 9/10 (10/18/21); up to 8/10 (11/17/2021); up to 5/10 (12/29/2021); up to 5/10 in the last 2 weeks (02/22/2022);  Goal status: partially met  4.  Patient will demonstrate right hip strength equal or greater than left hip strength without increase in symptoms to improve ability to perform functional activities such as navigating stairs, walking, and lifting with less difficulty.  Baseline: 1/2 MMT grade lower on R than left and symptomatic in some motions - see objective exam (10/18/21); improved strength in some motions and not painful in any motion  - see objective (11/17/2021); nearly met - see objective (12/29/2021);  met (02/22/22); Goal status: Achieved 02/22/2022  5.  Patient will complete community, work and/or recreational activities without limitation due to current condition.  Baseline: difficulty with prolonged positions, walking, sitting, working, hobbies, lifting, moving to a new house, going up and down stairs, etc.  (10/18/21); continues to have difficulty with these activities at about the same rate (11/17/2021); Patient reports all of these things have gotten better, but she is still limited by how long she can stand and walk. She rates her improvement since starting PT at approximately 50% improved (12/29/2021); Rates her improvement about 50% since starting PT and continues to have difficulty with  standing and walking and stairs (02/22/2022);  Goal status: partially met    PLAN: PT FREQUENCY: 1-2x/week  PT DURATION: 12 weeks  PLANNED INTERVENTIONS: Therapeutic exercises, Therapeutic activity, Neuromuscular re-education, Balance training, Patient/Family education, Joint mobilization, Dry Needling, Electrical stimulation, Spinal mobilization, Cryotherapy, Moist heat, and Manual therapy.  PLAN FOR NEXT SESSION: Patient is now discharged from PT.    Everlean Alstrom. Graylon Good, PT, DPT 02/22/22, 8:51 PM  [facility] [address] [place], [place] [address] P: [phone] I F: [phone]

## 2022-02-27 ENCOUNTER — Encounter: Payer: Medicare Other | Admitting: Physical Therapy

## 2022-03-01 ENCOUNTER — Encounter: Payer: Medicare Other | Admitting: Physical Therapy

## 2022-03-08 ENCOUNTER — Encounter: Payer: Medicare Other | Admitting: Physical Therapy

## 2022-03-15 ENCOUNTER — Encounter: Payer: Medicare Other | Admitting: Physical Therapy

## 2022-03-20 ENCOUNTER — Encounter: Payer: Medicare Other | Admitting: Physical Therapy

## 2022-03-22 ENCOUNTER — Encounter: Payer: Medicare Other | Admitting: Physical Therapy

## 2022-03-27 ENCOUNTER — Encounter: Payer: Medicare Other | Admitting: Physical Therapy

## 2022-03-29 ENCOUNTER — Encounter: Payer: Medicare Other | Admitting: Physical Therapy

## 2022-04-03 ENCOUNTER — Encounter: Payer: Medicare Other | Admitting: Physical Therapy

## 2022-04-05 ENCOUNTER — Encounter: Payer: Medicare Other | Admitting: Physical Therapy

## 2022-05-02 ENCOUNTER — Other Ambulatory Visit (HOSPITAL_COMMUNITY): Payer: Self-pay | Admitting: Orthopedic Surgery

## 2022-05-02 ENCOUNTER — Other Ambulatory Visit: Payer: Self-pay | Admitting: Orthopedic Surgery

## 2022-05-02 DIAGNOSIS — M5489 Other dorsalgia: Secondary | ICD-10-CM

## 2022-05-02 DIAGNOSIS — M4714 Other spondylosis with myelopathy, thoracic region: Secondary | ICD-10-CM

## 2022-05-10 ENCOUNTER — Ambulatory Visit
Admission: RE | Admit: 2022-05-10 | Discharge: 2022-05-10 | Disposition: A | Discharge status: Home / Self Care | Payer: Medicare Other | Source: Ambulatory Visit | Attending: Orthopedic Surgery | Admitting: Orthopedic Surgery

## 2022-05-10 DIAGNOSIS — M4714 Other spondylosis with myelopathy, thoracic region: Secondary | ICD-10-CM | POA: Diagnosis present

## 2022-05-10 DIAGNOSIS — M5489 Other dorsalgia: Secondary | ICD-10-CM | POA: Diagnosis not present

## 2022-06-14 ENCOUNTER — Other Ambulatory Visit: Payer: Self-pay | Admitting: Physician Assistant

## 2022-06-14 DIAGNOSIS — Z1231 Encounter for screening mammogram for malignant neoplasm of breast: Secondary | ICD-10-CM

## 2022-09-04 ENCOUNTER — Ambulatory Visit
Admission: RE | Admit: 2022-09-04 | Discharge: 2022-09-04 | Disposition: A | Discharge status: Home / Self Care | Payer: Medicare Other | Source: Ambulatory Visit | Attending: Physician Assistant | Admitting: Physician Assistant

## 2022-09-04 DIAGNOSIS — Z1231 Encounter for screening mammogram for malignant neoplasm of breast: Secondary | ICD-10-CM | POA: Diagnosis present

## 2023-06-21 ENCOUNTER — Other Ambulatory Visit: Payer: Self-pay | Admitting: Physician Assistant

## 2023-06-21 DIAGNOSIS — Z1231 Encounter for screening mammogram for malignant neoplasm of breast: Secondary | ICD-10-CM

## 2023-07-17 ENCOUNTER — Encounter: Payer: Self-pay | Admitting: Physician Assistant

## 2023-08-08 IMAGING — MG MM DIGITAL SCREENING BILAT W/ TOMO AND CAD
8 series · 8 of 24 positions shown · non-contrast
Comparison: Previous exam(s).

CLINICAL DATA: Screening.

EXAM:
DIGITAL SCREENING BILATERAL MAMMOGRAM WITH TOMOSYNTHESIS AND CAD
TECHNIQUE: Bilateral screening digital craniocaudal and mediolateral oblique
mammograms were obtained. Bilateral screening digital breast
tomosynthesis was performed. The images were evaluated with
computer-aided detection.

[R MLO synth-2D]
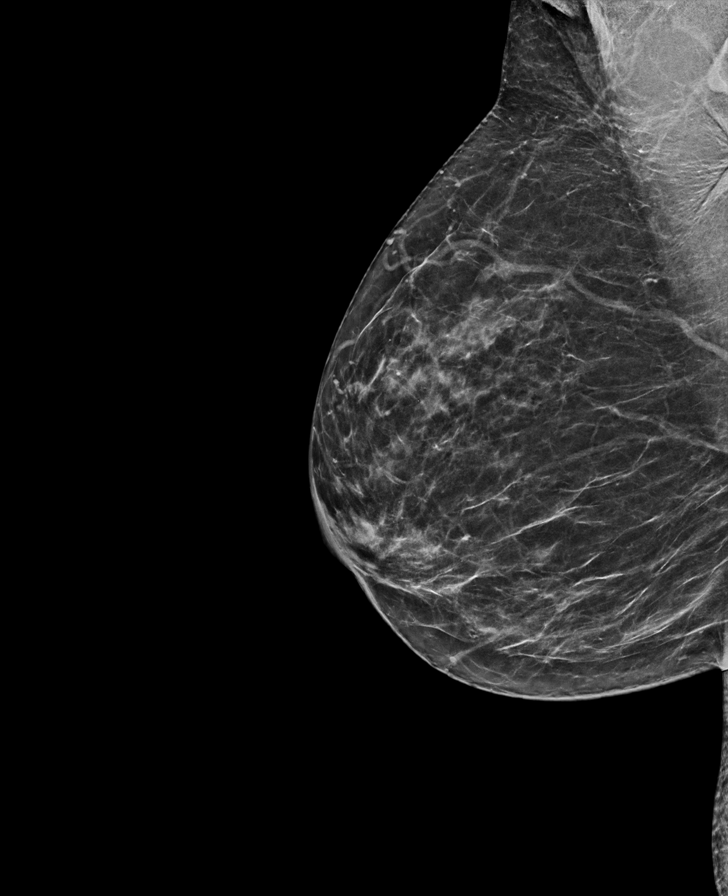

[R CC synth-2D]
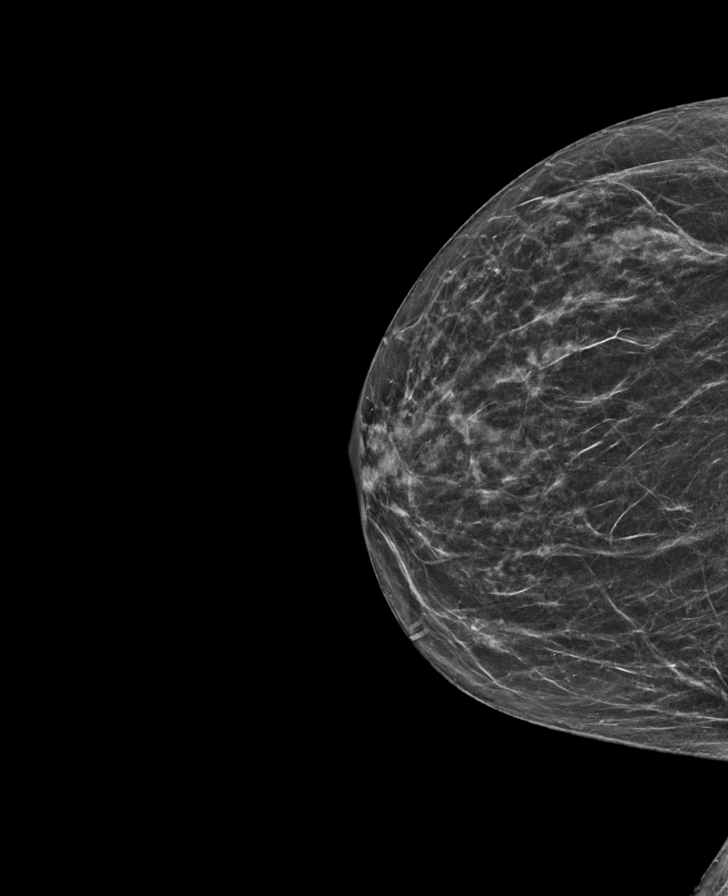

[L CC synth-2D]
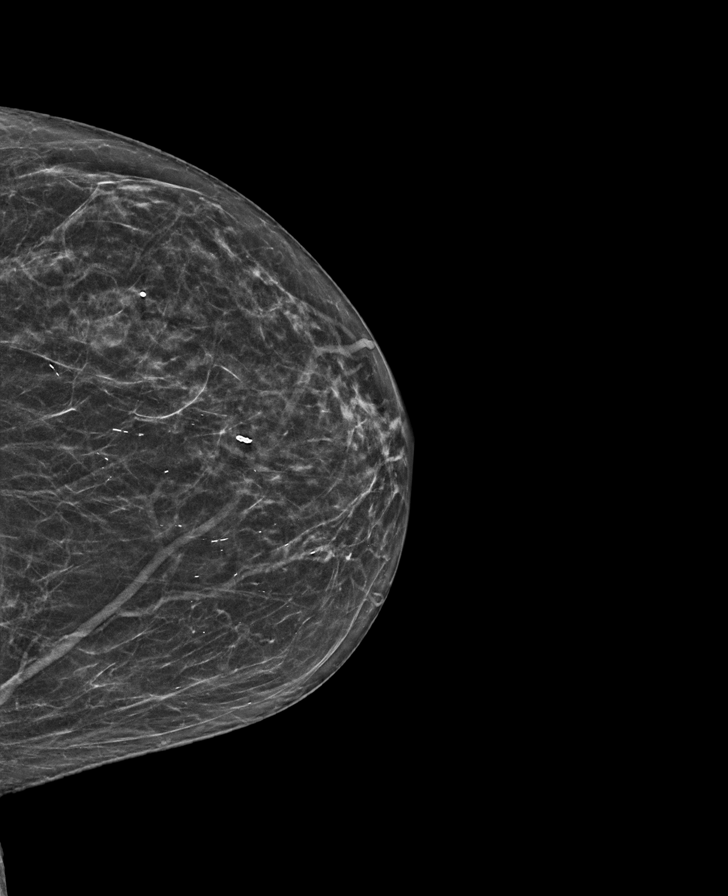

[L MLO synth-2D]
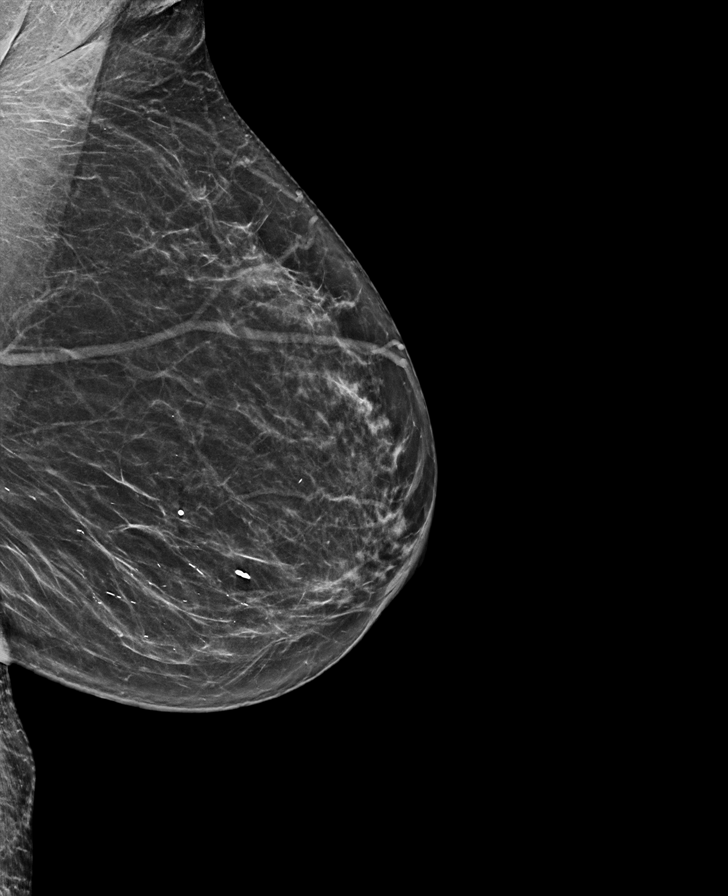

[L MLO tomo · tomo slice 33/65.0]
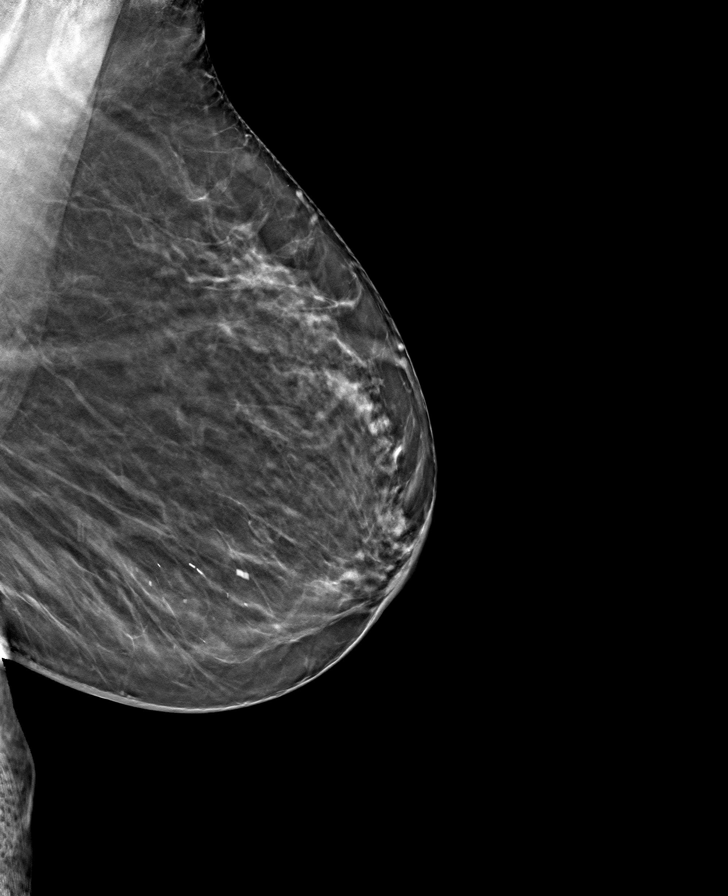

[R MLO tomo · tomo slice 33/65.0]
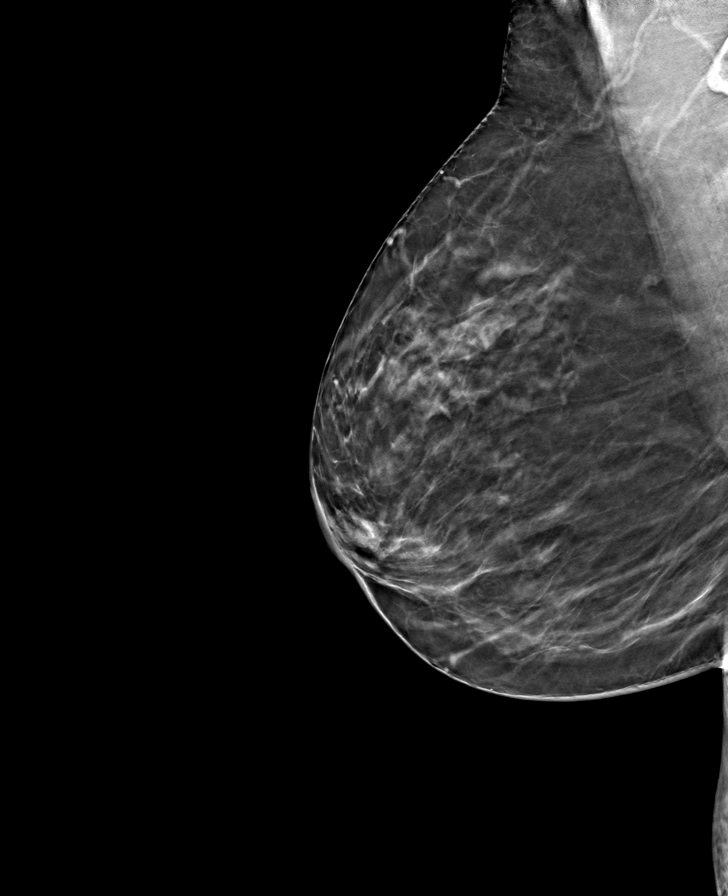

[R CC tomo · tomo slice 29/56.0]
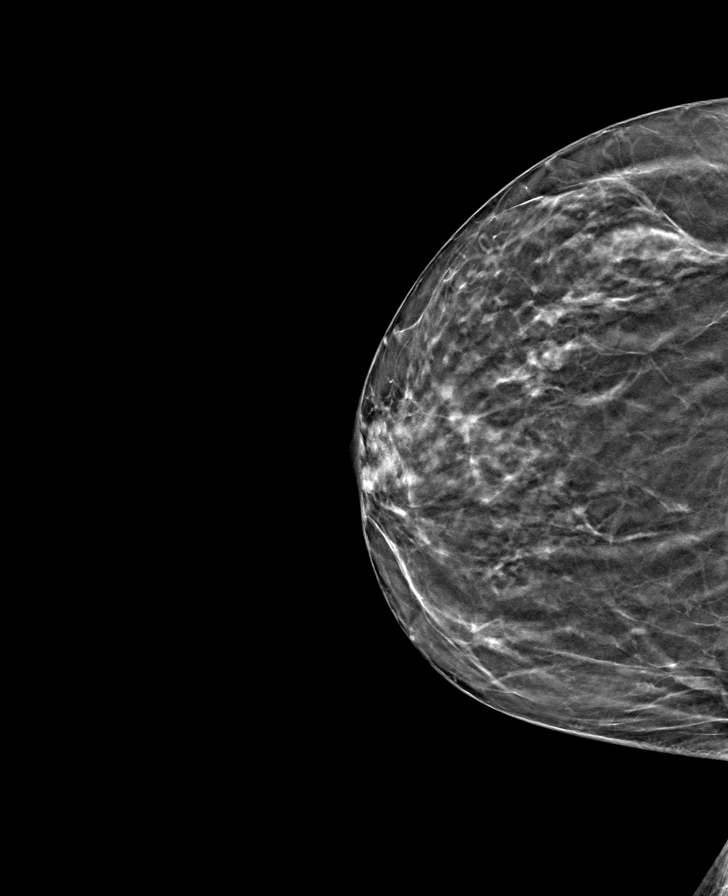

[L CC tomo · tomo slice 29/56.0]
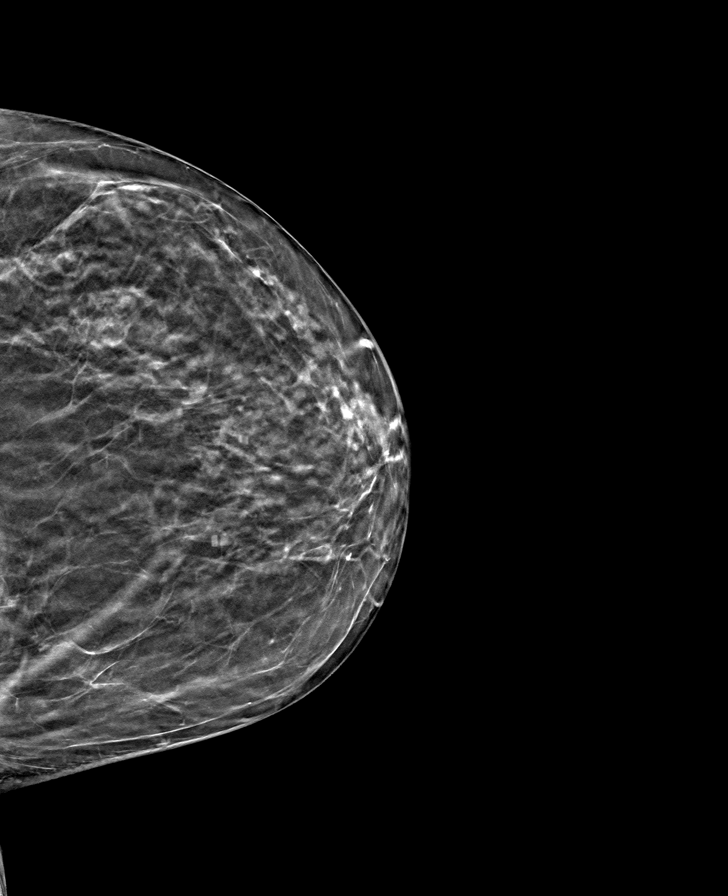

[8 of 24 positions shown; findings below may reference images not displayed]

ACR Breast Density Category b: There are scattered areas of
fibroglandular density.
FINDINGS: There are no findings suspicious for malignancy.
IMPRESSION: No mammographic evidence of malignancy. A result letter of this
screening mammogram will be mailed directly to the patient.

RECOMMENDATION:
Screening mammogram in one year. (Code:51-O-LD2)

BI-RADS CATEGORY  1: Negative.

## 2023-09-06 ENCOUNTER — Ambulatory Visit
Admission: RE | Admit: 2023-09-06 | Discharge: 2023-09-06 | Disposition: A | Discharge status: Home / Self Care | Payer: Medicare HMO | Source: Ambulatory Visit | Attending: Physician Assistant | Admitting: Physician Assistant

## 2023-09-06 DIAGNOSIS — Z1231 Encounter for screening mammogram for malignant neoplasm of breast: Secondary | ICD-10-CM | POA: Insufficient documentation

## 2023-11-03 IMAGING — CT CT T SPINE W/O CM
1 series · 12 of 14 positions shown, 15 images · non-contrast
Comparison: Radiography 03/29/2020

CLINICAL DATA: Low back pain.  History of scoliosis

EXAM:
CT THORACIC AND LUMBAR SPINE WITHOUT CONTRAST
TECHNIQUE: Multidetector CT imaging of the thoracic and lumbar spine was
performed without contrast. Multiplanar CT image reconstructions
were also generated.
RADIATION DOSE REDUCTION: This exam was performed according to the
departmental dose-optimization program which includes automated
exposure control, adjustment of the mA and/or kV according to
patient size and/or use of iterative reconstruction technique.

[Series 3: t-spine 2.0 st · axial · 0.31mm/px · z∈[-357,-101]mm · 12 of 152 slices shown, 15 images]
[im 12/152  soft-tissue]
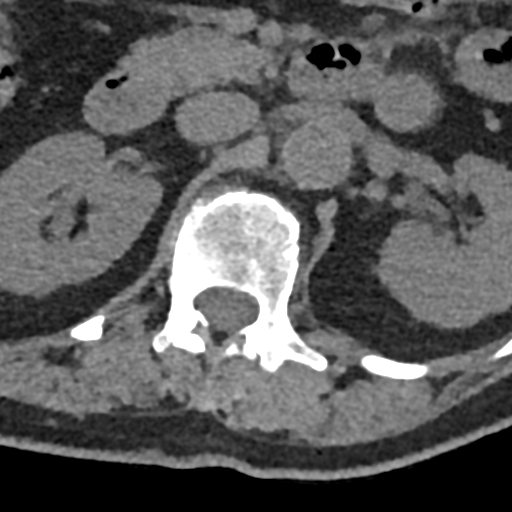
[im 12/152  bone]
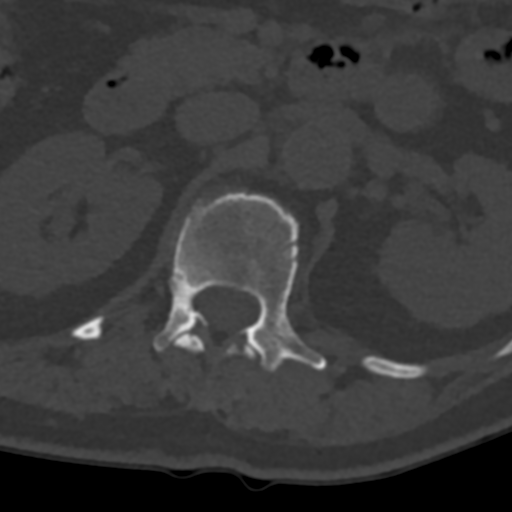
[im 24/152  bone]
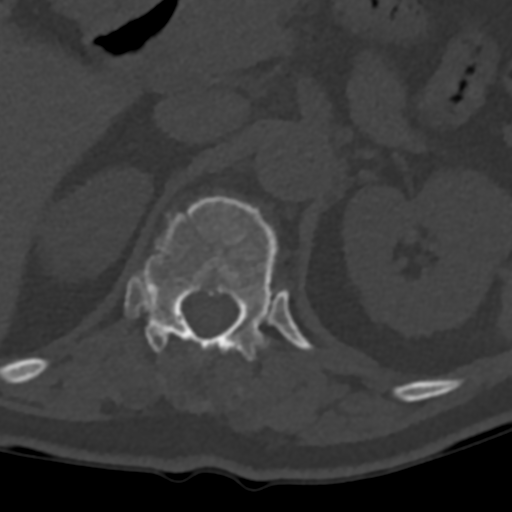
[im 35/152  bone]
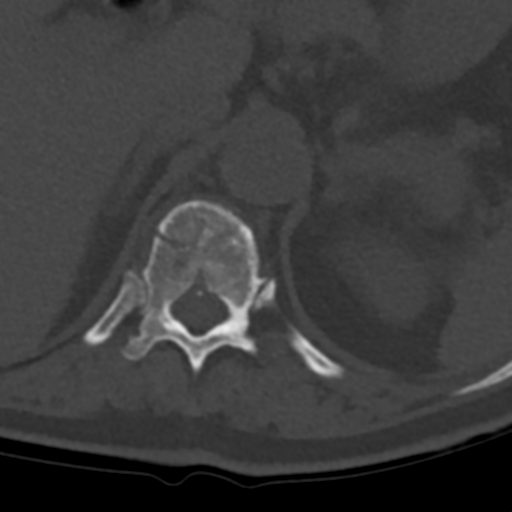
[im 47/152  bone]
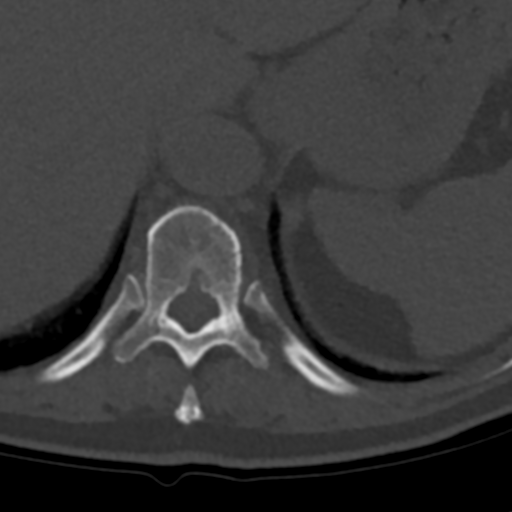
[im 59/152  soft-tissue]
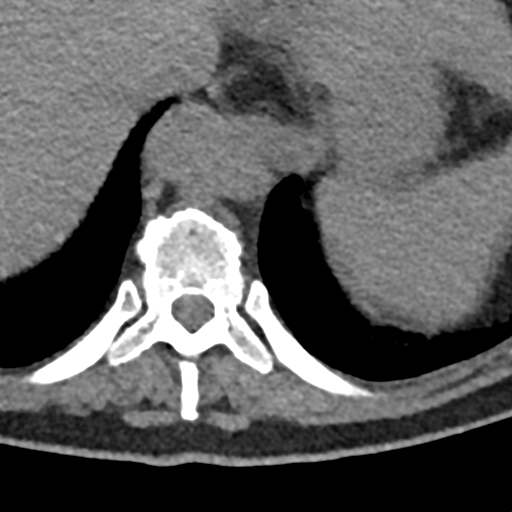
[im 59/152  bone]
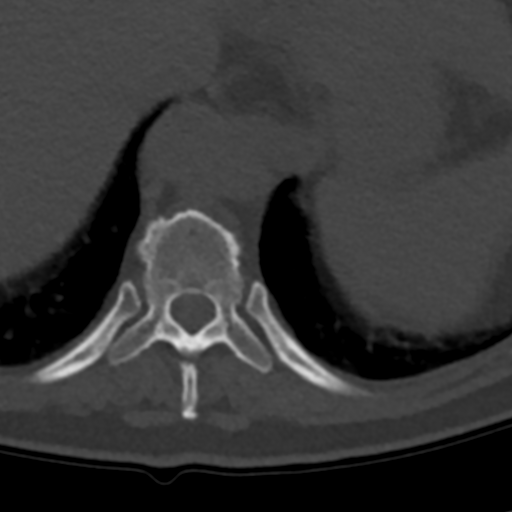
[im 70/152  bone]
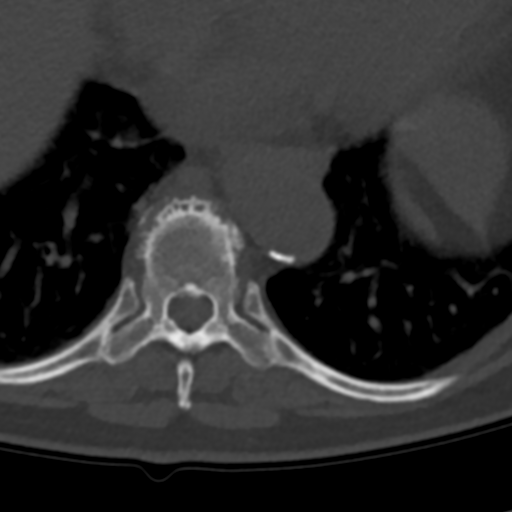
[im 82/152  bone]
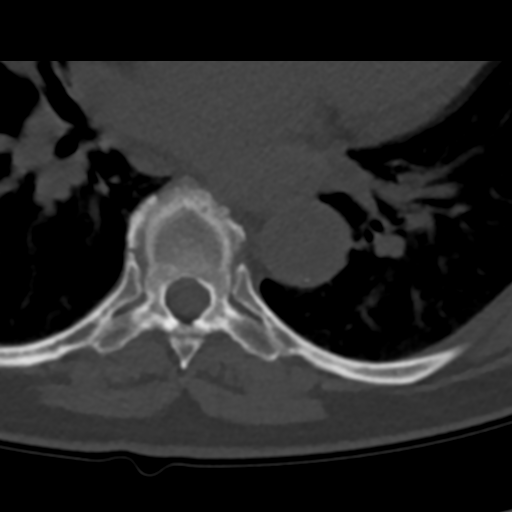
[im 93/152  bone]
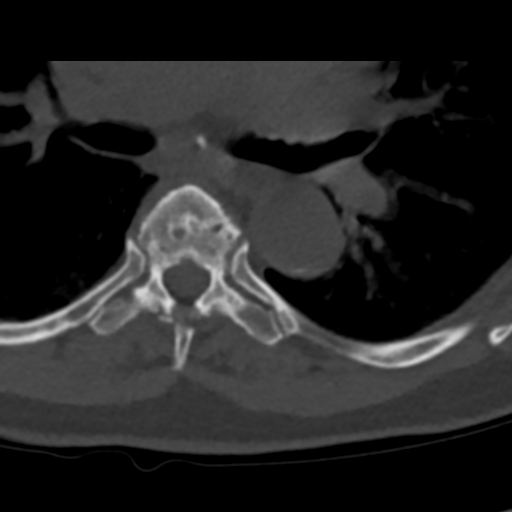
[im 105/152  soft-tissue]
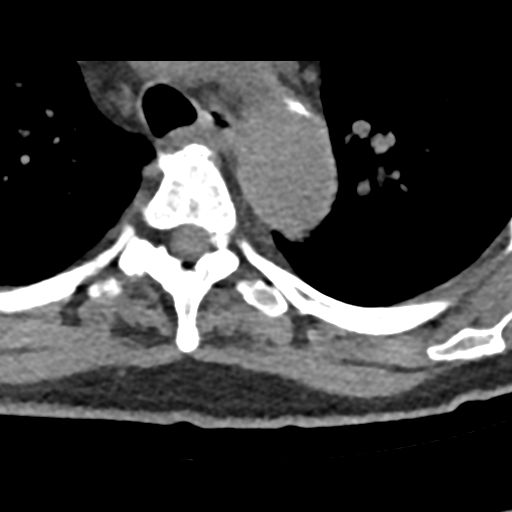
[im 105/152  bone]
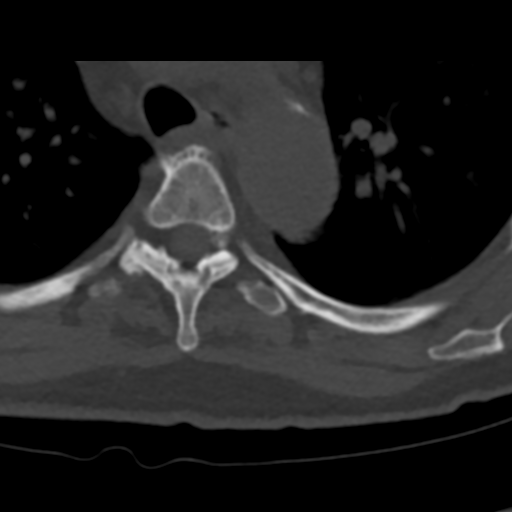
[im 117/152  bone]
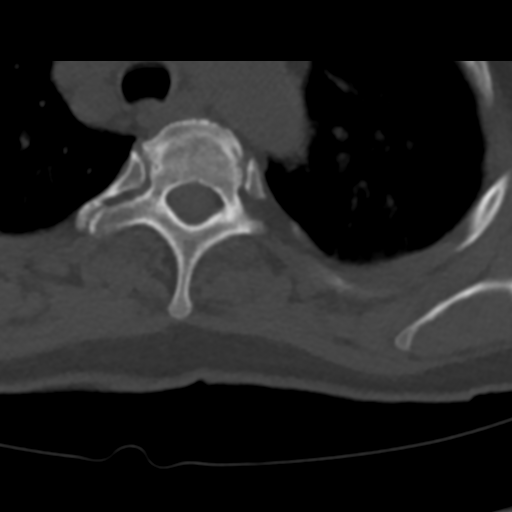
[im 128/152  bone]
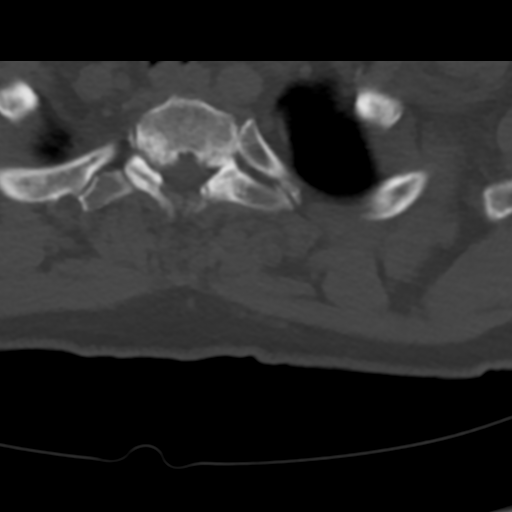
[im 140/152  bone]
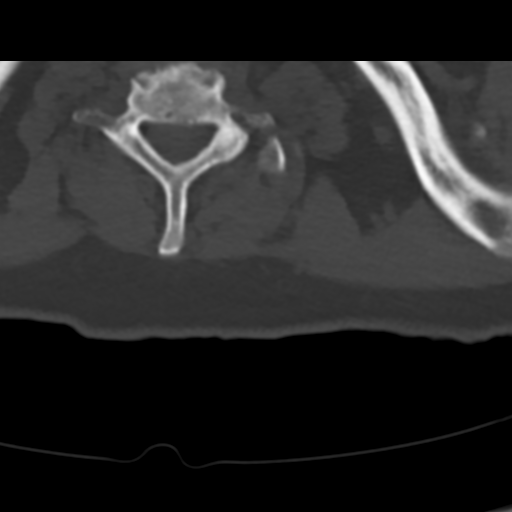

[12 of 14 positions shown; findings below may reference images not displayed]

FINDINGS: CT THORACIC SPINE FINDINGS

Alignment: Negative for listhesis.

Vertebrae: Negative for fracture or aggressive bone lesion.

Paraspinal and other soft tissues: No evidence of perispinal mass or
inflammation.

Disc levels: Every level shows disc collapse with endplate
degeneration and mild to moderate endplate spurring. Also also
generalized is mild-to-moderate facet spurring.

Facet spurring especially affects right-sided foramina at T1-2 and
T2-3 where there is advanced narrowing. Additionally, notable left
foraminal stenosis from spurring at T10-11. Elsewhere foraminal fat
is well preserved.

At the level of the spinal canal, tiny disc protrusions are likely
present at T7-8 to T9-10. Notable ligamentum flavum thickening and
ossification eccentric to the left at T10-11 where there is likely
cord contact from behind.

CT LUMBAR SPINE FINDINGS

Segmentation: 5 lumbar type vertebrae

Alignment: Marked levoscoliosis and reversal of lumbar lordosis.

Vertebrae: No evidence of fracture or bone lesion

Paraspinal and other soft tissues: Negative for perispinal mass or
inflammation.

Disc levels:

T12- L1: Disc collapse with gas containing fissure. No neural
impingement

L1-L2: Disc collapse with eccentric right disc bulging and endplate
ridging. Asymmetric right facet spurring. Advanced right foraminal
impingement.

L2-L3: Disc collapse with endplate and facet spurring eccentric to
the right where there is foraminal impingement. Right subarticular
recess stenosis

L3-L4: Degenerative disc collapse with endplate and facet spurring
bilaterally. High-grade spinal stenosis. Biforaminal impingement,
particularly severe on the right

L4-L5: Disc collapse with asymmetric left endplate and facet
spurring. Advanced foraminal stenosis on the left. Advanced spinal
stenosis

L5-S1:Asymmetric leftward disc collapse and endplate/facet spurring
with left foraminal impingement.
IMPRESSION: CT THORACIC SPINE IMPRESSION

1. Generalized disc and facet degeneration with spurs causing
foraminal impingement on the right at T1-2, T2-3 and on the left at
T10-11.
2. Notable ligamentum flavum thickening and calcification at T10-11
on the left where there could be cord contact.

CT LUMBAR SPINE IMPRESSION

1. Severe and generalized degeneration with levoscoliosis.
2. High-grade spinal stenosis at L3-4 and L4-5.
3. Right foraminal impingement at L1-2 to L3-4 and left foraminal
impingement at L3-4 to L5-S1.

## 2023-11-03 IMAGING — CT CT L SPINE W/O CM
1 of 7 series · 6 of 14 positions shown, 8 images · non-contrast
Comparison: Radiography 03/29/2020

CLINICAL DATA: Low back pain.  History of scoliosis

EXAM:
CT THORACIC AND LUMBAR SPINE WITHOUT CONTRAST
TECHNIQUE: Multidetector CT imaging of the thoracic and lumbar spine was
performed without contrast. Multiplanar CT image reconstructions
were also generated.
RADIATION DOSE REDUCTION: This exam was performed according to the
departmental dose-optimization program which includes automated
exposure control, adjustment of the mA and/or kV according to
patient size and/or use of iterative reconstruction technique.

[Series 2: l-spine 2.0 st · axial · 0.30mm/px · z∈[-288,-126]mm · 6 of 115 slices shown, 8 images]
[im 17/115  soft-tissue]
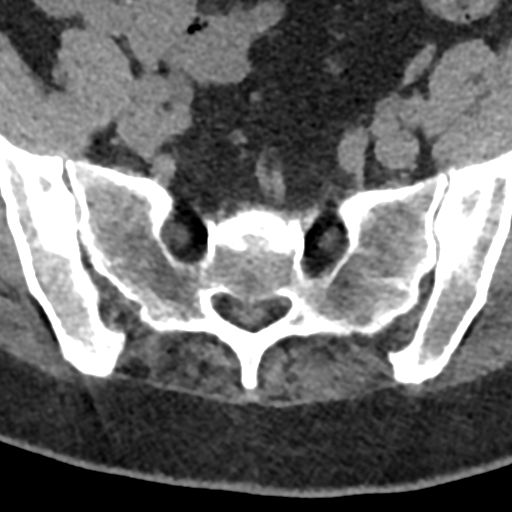
[im 17/115  bone]
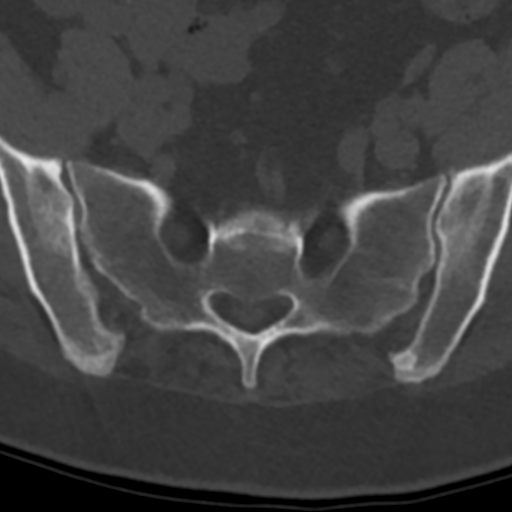
[im 33/115  bone]
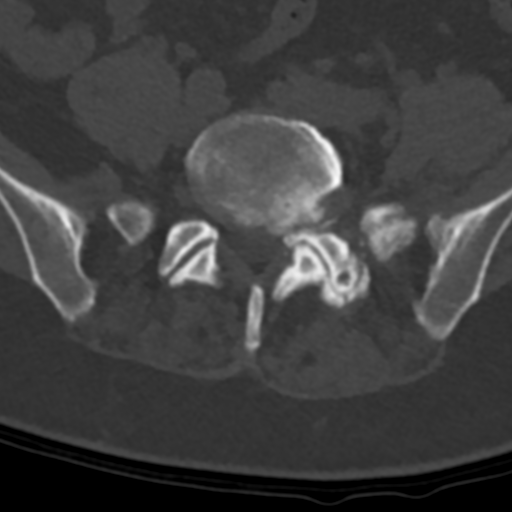
[im 49/115  bone]
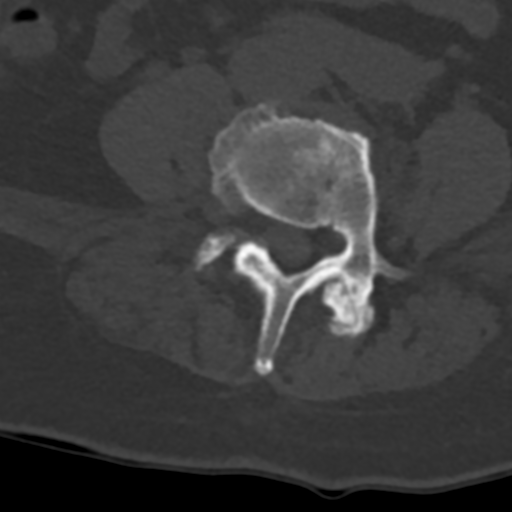
[im 66/115  bone]
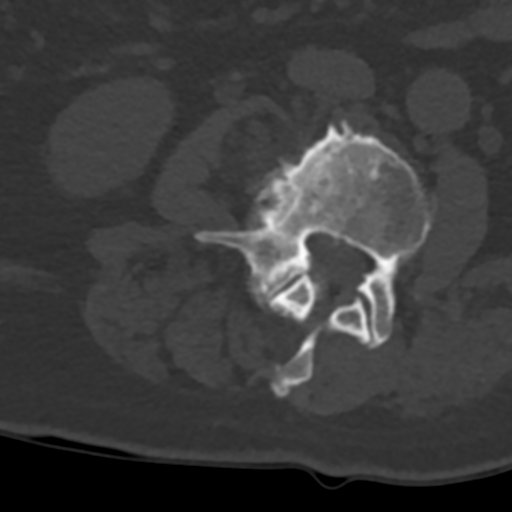
[im 82/115  soft-tissue]
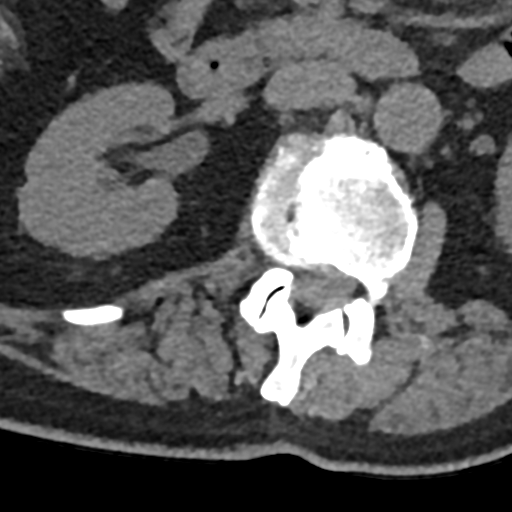
[im 82/115  bone]
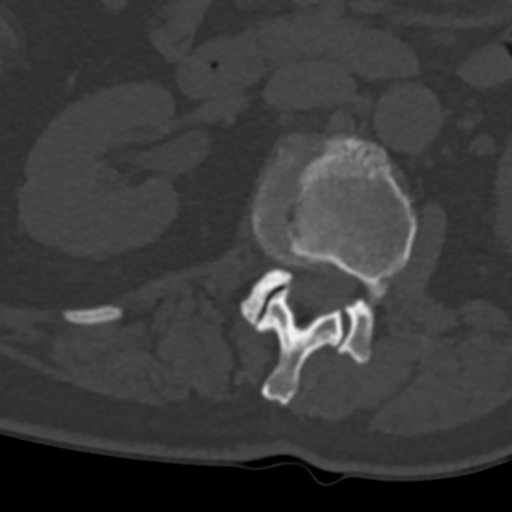
[im 98/115  bone]
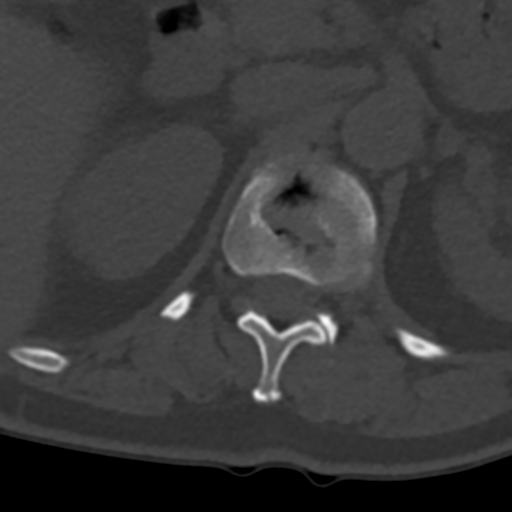

[6 of 14 positions shown; findings below may reference images not displayed]

FINDINGS: CT THORACIC SPINE FINDINGS

Alignment: Negative for listhesis.

Vertebrae: Negative for fracture or aggressive bone lesion.

Paraspinal and other soft tissues: No evidence of perispinal mass or
inflammation.

Disc levels: Every level shows disc collapse with endplate
degeneration and mild to moderate endplate spurring. Also also
generalized is mild-to-moderate facet spurring.

Facet spurring especially affects right-sided foramina at T1-2 and
T2-3 where there is advanced narrowing. Additionally, notable left
foraminal stenosis from spurring at T10-11. Elsewhere foraminal fat
is well preserved.

At the level of the spinal canal, tiny disc protrusions are likely
present at T7-8 to T9-10. Notable ligamentum flavum thickening and
ossification eccentric to the left at T10-11 where there is likely
cord contact from behind.

CT LUMBAR SPINE FINDINGS

Segmentation: 5 lumbar type vertebrae

Alignment: Marked levoscoliosis and reversal of lumbar lordosis.

Vertebrae: No evidence of fracture or bone lesion

Paraspinal and other soft tissues: Negative for perispinal mass or
inflammation.

Disc levels:

T12- L1: Disc collapse with gas containing fissure. No neural
impingement

L1-L2: Disc collapse with eccentric right disc bulging and endplate
ridging. Asymmetric right facet spurring. Advanced right foraminal
impingement.

L2-L3: Disc collapse with endplate and facet spurring eccentric to
the right where there is foraminal impingement. Right subarticular
recess stenosis

L3-L4: Degenerative disc collapse with endplate and facet spurring
bilaterally. High-grade spinal stenosis. Biforaminal impingement,
particularly severe on the right

L4-L5: Disc collapse with asymmetric left endplate and facet
spurring. Advanced foraminal stenosis on the left. Advanced spinal
stenosis

L5-S1:Asymmetric leftward disc collapse and endplate/facet spurring
with left foraminal impingement.
IMPRESSION: CT THORACIC SPINE IMPRESSION

1. Generalized disc and facet degeneration with spurs causing
foraminal impingement on the right at T1-2, T2-3 and on the left at
T10-11.
2. Notable ligamentum flavum thickening and calcification at T10-11
on the left where there could be cord contact.

CT LUMBAR SPINE IMPRESSION

1. Severe and generalized degeneration with levoscoliosis.
2. High-grade spinal stenosis at L3-4 and L4-5.
3. Right foraminal impingement at L1-2 to L3-4 and left foraminal
impingement at L3-4 to L5-S1.

## 2024-03-30 NOTE — H&P (Signed)
 Pre-Procedure H&P   Patient ID: Kathryn Whitehead is a 68 y.o. female.  Gastroenterology Provider: Elspeth Ozell Jungling, DO  Referring Provider: Eva Crimes, PA PCP: Crimes Eva POUR, [place]  Date: 03/31/2024  HPI Ms. Kathryn Whitehead is a 68 y.o. female who presents today for Colonoscopy for Personal history of colon polyps .  Last underwent colonoscopy in October 2022 with 7 adenomatous polyps removed  Reports daily bowel movement without melena or hematochezia Hemoglobin 13.3 MCV 92 platelets 236,000 creatinine 0.7  Status post ACDF of C4 and C5   Past Medical History:  Diagnosis Date   Degenerative disc disease, lumbar    Difficult intubation    GERD (gastroesophageal reflux disease)    Hypercholesterolemia    Hypertension    Hypothyroidism    Late latent syphilis    Osteopenia    Reactive airway disease, moderate persistent, uncomplicated    Sinus bradycardia    Thyroid  disease     Past Surgical History:  Procedure Laterality Date   ANTERIOR CERVICAL DECOMP/DISCECTOMY FUSION N/A 04/23/2019   Procedure: ANTERIOR CERVICAL DECOMPRESSION/DISCECTOMY FUSION 1 LEVEL C4-5;  Surgeon: Clois Fret, MD;  Location: [facility] ORS;  Service: Neurosurgery;  Laterality: N/A;   BACK SURGERY  1994   herniated disc removed lumbar area ([place] )   CESAREAN SECTION  1983   COLONOSCOPY     COLONOSCOPY WITH PROPOFOL  N/A 04/29/2021   Procedure: COLONOSCOPY WITH PROPOFOL ;  Surgeon: Jungling Elspeth Ozell, DO;  Location: [facility] ENDOSCOPY;  Service: Gastroenterology;  Laterality: N/A;   TONSILLECTOMY     TONSILLECTOMY AND ADENOIDECTOMY     teenager   TUBAL LIGATION      Family History No h/o GI disease or malignancy  Review of Systems  Constitutional:  Negative for activity change, appetite change, chills, diaphoresis, fatigue, fever and unexpected weight change.  HENT:  Negative for trouble swallowing and voice change.   Respiratory:  Negative for shortness of breath and  wheezing.   Cardiovascular:  Negative for chest pain, palpitations and leg swelling.  Gastrointestinal:  Negative for abdominal distention, abdominal pain, anal bleeding, blood in stool, constipation, diarrhea, nausea, rectal pain and vomiting.  Musculoskeletal:  Negative for arthralgias and myalgias.  Skin:  Negative for color change and pallor.  Neurological:  Negative for dizziness, syncope and weakness.  Psychiatric/Behavioral:  Negative for confusion.   All other systems reviewed and are negative.    Medications No current facility-administered medications on file prior to encounter.   Current Outpatient Medications on File Prior to Encounter  Medication Sig Dispense Refill   calcium  carbonate (OSCAL) 1500 (600 Ca) MG TABS tablet Take 600 mg of elemental calcium  by mouth daily with breakfast.     cholecalciferol  (VITAMIN D3) 25 MCG (1000 UT) tablet Take 1,000 Units by mouth daily.     hydrALAZINE  (APRESOLINE ) 50 MG tablet Take 50 mg by mouth 2 (two) times daily.     hydrochlorothiazide  (HYDRODIURIL ) 25 MG tablet Take 25 mg by mouth daily.     levothyroxine  (SYNTHROID ) 75 MCG tablet Take 75 mcg by mouth daily before breakfast.     Nutritional Supplements (ESTROVEN PO) Take 1 tablet by mouth daily.     pantoprazole  (PROTONIX ) 40 MG tablet Take 40 mg by mouth daily.     Potassium 99 MG TABS Take by mouth.     simvastatin  (ZOCOR ) 10 MG tablet Take 10 mg by mouth at bedtime.     acetaminophen  (TYLENOL ) 325 MG tablet Take 2 tablets (650 mg total) by  mouth every 4 (four) hours as needed for mild pain ((score 1 to 3) or temp > 100.5).     brompheniramine-pseudoephedrine-DM 30-2-10 MG/5ML syrup Take 5 mLs by mouth 4 (four) times daily as needed. 120 mL 0   celecoxib  (CELEBREX ) 200 MG capsule Take 1 capsule (200 mg total) by mouth every 12 (twelve) hours. (Patient not taking: Reported on 04/29/2021) 30 capsule 0   diphenhydramine -acetaminophen  (TYLENOL  PM) 25-500 MG TABS tablet Take 1-2 tablets  by mouth at bedtime as needed (pain/sleep).     fluticasone (FLONASE) 50 MCG/ACT nasal spray Place into both nostrils daily.     hydroxypropyl methylcellulose / hypromellose (ISOPTO TEARS / GONIOVISC) 2.5 % ophthalmic solution Place 1 drop into both eyes 3 (three) times daily as needed for dry eyes.     levothyroxine  (SYNTHROID ) 88 MCG tablet Take 88 mcg by mouth daily before breakfast.     meloxicam (MOBIC) 15 MG tablet Take 15 mg by mouth daily. (Patient not taking: Reported on 03/31/2024)     methocarbamol  (ROBAXIN ) 500 MG tablet Take 1 tablet (500 mg total) by mouth every 6 (six) hours as needed for muscle spasms. (Patient not taking: Reported on 04/29/2021) 120 tablet 0   oxyCODONE  (OXY IR/ROXICODONE ) 5 MG immediate release tablet Take 1 tablet (5 mg total) by mouth every 3 (three) hours as needed for moderate pain ((score 4 to 6)). (Patient not taking: Reported on 03/31/2024) 30 tablet 0   PARoxetine  Mesylate 7.5 MG CAPS Take 7.5 mg by mouth daily. (Patient not taking: Reported on 04/29/2021)     senna (SENOKOT) 8.6 MG tablet Take 1 tablet by mouth daily.     triamcinolone cream (KENALOG) 0.1 % Apply 1 application topically 2 (two) times daily.      Pertinent medications related to GI and procedure were reviewed by me with the patient prior to the procedure   Current Facility-Administered Medications:    0.9 %  sodium chloride  infusion, , Intravenous, Continuous, Kathryn Elspeth Sharper, DO, Last Rate: 20 mL/hr at 03/31/24 9145, Continued from Pre-op at 03/31/24 0854  sodium chloride  20 mL/hr at 03/31/24 9145       Allergies  Allergen Reactions   Amlodipine Itching   Ampicillin Rash   Montelukast Itching   Allergies were reviewed by me prior to the procedure  Objective   Body mass index is 27.93 kg/m. Vitals:   03/31/24 0841  BP: 129/73  Pulse: 65  Resp: 16  Temp: (!) 96.3 F (35.7 C)  TempSrc: Temporal  SpO2: 98%  Weight: 64.9 kg  Height: 5' (1.524 m)     Physical  Exam Vitals and nursing note reviewed.  Constitutional:      General: She is not in acute distress.    Appearance: Normal appearance. She is not ill-appearing, toxic-appearing or diaphoretic.  HENT:     Head: Normocephalic and atraumatic.     Nose: Nose normal.     Mouth/Throat:     Mouth: Mucous membranes are moist.     Pharynx: Oropharynx is clear.  Eyes:     General: No scleral icterus.    Extraocular Movements: Extraocular movements intact.  Cardiovascular:     Rate and Rhythm: Regular rhythm. Bradycardia present.     Heart sounds: Normal heart sounds. No murmur heard.    No friction rub. No gallop.  Pulmonary:     Effort: Pulmonary effort is normal. No respiratory distress.     Breath sounds: Normal breath sounds. No wheezing, rhonchi or rales.  Abdominal:     General: Bowel sounds are normal. There is no distension.     Palpations: Abdomen is soft.     Tenderness: There is no abdominal tenderness. There is no guarding or rebound.  Musculoskeletal:     Cervical back: Neck supple.     Right lower leg: No edema.     Left lower leg: No edema.  Skin:    General: Skin is warm and dry.     Coloration: Skin is not jaundiced or pale.  Neurological:     General: No focal deficit present.     Mental Status: She is alert and oriented to person, place, and time. Mental status is at baseline.  Psychiatric:        Mood and Affect: Mood normal.        Behavior: Behavior normal.        Thought Content: Thought content normal.        Judgment: Judgment normal.      Assessment:  Ms. Kathryn Whitehead is a 68 y.o. female  who presents today for Colonoscopy for Personal history of colon polyps .  Plan:  Colonoscopy with possible intervention today  Colonoscopy with possible biopsy, control of bleeding, polypectomy, and interventions as necessary has been discussed with the patient/patient representative. Informed consent was obtained from the patient/patient representative after  explaining the indication, nature, and risks of the procedure including but not limited to death, bleeding, perforation, missed neoplasm/lesions, cardiorespiratory compromise, and reaction to medications. Opportunity for questions was given and appropriate answers were provided. Patient/patient representative has verbalized understanding is amenable to undergoing the procedure.   Elspeth Ozell Jungling, DO  [facility] Gastroenterology  Portions of the record may have been created with voice recognition software. Occasional wrong-word or 'sound-a-like' substitutions may have occurred due to the inherent limitations of voice recognition software.  Read the chart carefully and recognize, using context, where substitutions may have occurred.

## 2024-03-31 ENCOUNTER — Ambulatory Visit: Admitting: Anesthesiology

## 2024-03-31 ENCOUNTER — Encounter: Payer: Self-pay | Admitting: Gastroenterology

## 2024-03-31 ENCOUNTER — Ambulatory Visit
Admission: RE | Admit: 2024-03-31 | Discharge: 2024-03-31 | Disposition: A | Discharge status: Home / Self Care | Attending: Gastroenterology | Admitting: Gastroenterology

## 2024-03-31 ENCOUNTER — Encounter
Admission: RE | Disposition: A | Discharge status: Home / Self Care | Payer: Self-pay | Source: Home / Self Care | Attending: Gastroenterology

## 2024-03-31 ENCOUNTER — Other Ambulatory Visit: Payer: Self-pay

## 2024-03-31 DIAGNOSIS — D128 Benign neoplasm of rectum: Secondary | ICD-10-CM | POA: Diagnosis not present

## 2024-03-31 DIAGNOSIS — E7800 Pure hypercholesterolemia, unspecified: Secondary | ICD-10-CM | POA: Diagnosis not present

## 2024-03-31 DIAGNOSIS — I10 Essential (primary) hypertension: Secondary | ICD-10-CM | POA: Insufficient documentation

## 2024-03-31 DIAGNOSIS — Z791 Long term (current) use of non-steroidal anti-inflammatories (NSAID): Secondary | ICD-10-CM | POA: Diagnosis not present

## 2024-03-31 DIAGNOSIS — E039 Hypothyroidism, unspecified: Secondary | ICD-10-CM | POA: Insufficient documentation

## 2024-03-31 DIAGNOSIS — K219 Gastro-esophageal reflux disease without esophagitis: Secondary | ICD-10-CM | POA: Diagnosis not present

## 2024-03-31 DIAGNOSIS — D122 Benign neoplasm of ascending colon: Secondary | ICD-10-CM | POA: Diagnosis not present

## 2024-03-31 DIAGNOSIS — Z1211 Encounter for screening for malignant neoplasm of colon: Secondary | ICD-10-CM | POA: Diagnosis present

## 2024-03-31 DIAGNOSIS — Z79890 Hormone replacement therapy (postmenopausal): Secondary | ICD-10-CM | POA: Diagnosis not present

## 2024-03-31 DIAGNOSIS — Z79899 Other long term (current) drug therapy: Secondary | ICD-10-CM | POA: Insufficient documentation

## 2024-03-31 DIAGNOSIS — D124 Benign neoplasm of descending colon: Secondary | ICD-10-CM | POA: Diagnosis not present

## 2024-03-31 HISTORY — PX: POLYPECTOMY: SHX149

## 2024-03-31 HISTORY — PX: COLONOSCOPY: SHX5424

## 2024-03-31 SURGERY — COLONOSCOPY
Anesthesia: General

## 2024-03-31 MED ORDER — PROPOFOL 10 MG/ML IV BOLUS
INTRAVENOUS | Status: DC | PRN
  Administered 2024-03-31 (×4): 20 mg via INTRAVENOUS
  Administered 2024-03-31: 30 mg via INTRAVENOUS
  Administered 2024-03-31 (×4): 20 mg via INTRAVENOUS
  Administered 2024-03-31: 40 mg via INTRAVENOUS
  Administered 2024-03-31: 20 mg via INTRAVENOUS

## 2024-03-31 MED ORDER — SODIUM CHLORIDE 0.9 % IV SOLN: INTRAVENOUS | Status: DC

## 2024-03-31 MED ORDER — LIDOCAINE HCL (PF) 2 % IJ SOLN
INTRAMUSCULAR | Status: DC | PRN
  Administered 2024-03-31: 40 mg via INTRADERMAL

## 2024-03-31 NOTE — Op Note (Signed)
 [facility] Gastroenterology Patient Name: Kathryn Whitehead Procedure Date: 03/31/2024 8:51 AM MRN: [id number] Account #: [id number] Date of Birth: [birth year] Admit Type: Outpatient Age: 68 Room: [facility] ENDO ROOM 2 Gender: Female Note Status: Finalized Instrument Name: Colon Scope [id number] Procedure:             Colonoscopy Indications:           High risk colon cancer surveillance: Personal history                         of colonic polyps Providers:             Elspeth Ozell Onita ROSALEA, DO Referring MD:          Elspeth Ozell Onita DO, DO (Referring MD) Medicines:             Monitored Anesthesia Care Complications:         No immediate complications. Estimated blood loss:                         Minimal. Procedure:             Pre-Anesthesia Assessment:                        - Prior to the procedure, a History and Physical was                         performed, and patient medications and allergies were                         reviewed. The patient is competent. The risks and                         benefits of the procedure and the sedation options and                         risks were discussed with the patient. All questions                         were answered and informed consent was obtained.                         Patient identification and proposed procedure were                         verified by the physician, the nurse, the anesthetist                         and the technician in the endoscopy suite. Mental                         Status Examination: alert and oriented. Airway                         Examination: normal oropharyngeal airway and neck                         mobility. Respiratory Examination: clear to  auscultation. CV Examination: RRR, no murmurs, no S3                         or S4. Prophylactic Antibiotics: The patient does not                         require prophylactic antibiotics. Prior                          Anticoagulants: The patient has taken no anticoagulant                         or antiplatelet agents. ASA Grade Assessment: II - A                         patient with mild systemic disease. After reviewing                         the risks and benefits, the patient was deemed in                         satisfactory condition to undergo the procedure. The                         anesthesia plan was to use monitored anesthesia care                         (MAC). Immediately prior to administration of                         medications, the patient was re-assessed for adequacy                         to receive sedatives. The heart rate, respiratory                         rate, oxygen saturations, blood pressure, adequacy of                         pulmonary ventilation, and response to care were                         monitored throughout the procedure. The physical                         status of the patient was re-assessed after the                         procedure.                        After obtaining informed consent, the colonoscope was                         passed under direct vision. Throughout the procedure,                         the patient's blood pressure, pulse, and oxygen  saturations were monitored continuously. The                         Colonoscope was introduced through the anus and                         advanced to the the terminal ileum, with                         identification of the appendiceal orifice and IC                         valve. The colonoscopy was performed without                         difficulty. The patient tolerated the procedure well.                         The quality of the bowel preparation was evaluated                         using the BBPS [facility] Bowel Preparation Scale) with                         scores of: Right Colon = 3, Transverse Colon = 3 and                         Left Colon = 3 (entire  mucosa seen well with no                         residual staining, small fragments of stool or opaque                         liquid). The total BBPS score equals 9. The terminal                         ileum, ileocecal valve, appendiceal orifice, and                         rectum were photographed. Findings:      The perianal and digital rectal examinations were normal. Pertinent       negatives include normal sphincter tone.      The terminal ileum appeared normal. Estimated blood loss: none.      Retroflexion in the right colon was performed.      Four sessile polyps were found in the rectum (2), descending colon and       ascending colon. The polyps were 1 to 2 mm in size. These polyps were       removed with a jumbo cold forceps. Resection and retrieval were       complete. Estimated blood loss was minimal.      A 3 to 4 mm polyp was found in the descending colon. The polyp was       sessile. The polyp was removed with a cold snare. Resection and       retrieval were complete. Estimated blood loss was minimal.      The exam was otherwise without abnormality on direct and retroflexion  views. Impression:            - The examined portion of the ileum was normal.                        - Four 1 to 2 mm polyps in the rectum, in the                         descending colon and in the ascending colon, removed                         with a jumbo cold forceps. Resected and retrieved.                        - One 3 to 4 mm polyp in the descending colon, removed                         with a cold snare. Resected and retrieved.                        - The examination was otherwise normal on direct and                         retroflexion views. Recommendation:        - Patient has a contact number available for                         emergencies. The signs and symptoms of potential                         delayed complications were discussed with the patient.                          Return to normal activities tomorrow. Written                         discharge instructions were provided to the patient.                        - Discharge patient to home.                        - Resume previous diet.                        - Continue present medications.                        - No ibuprofen, naproxen, or other non-steroidal                         anti-inflammatory drugs for 5 days after polyp removal.                        - Await pathology results.                        - Repeat colonoscopy for surveillance based on  pathology results.                        - Return to referring physician as previously                         scheduled.                        - The findings and recommendations were discussed with                         the patient. Procedure Code(s):     --- Professional ---                        [id number], Colonoscopy, flexible; with removal of                         tumor(s), polyp(s), or other lesion(s) by snare                         technique                        45380, 59, Colonoscopy, flexible; with biopsy, single                         or multiple Diagnosis Code(s):     --- Professional ---                        Z86.010, Personal history of colonic polyps                        D12.8, Benign neoplasm of rectum                        D12.4, Benign neoplasm of descending colon                        D12.2, Benign neoplasm of ascending colon CPT copyright 2022 American Medical Association. All rights reserved. The codes documented in this report are preliminary and upon coder review may  be revised to meet current compliance requirements. Attending Participation:      I personally performed the entire procedure. Elspeth Jungling, DO Elspeth Ozell Jungling DO, DO 03/31/2024 9:46:24 AM This report has been signed electronically. Number of Addenda: 0 Note Initiated On: 03/31/2024 8:51 AM Scope Withdrawal Time: 0  hours 18 minutes 14 seconds  Total Procedure Duration: 0 hours 21 minutes 59 seconds  Estimated Blood Loss:  Estimated blood loss was minimal.      [facility]

## 2024-03-31 NOTE — Anesthesia Preprocedure Evaluation (Signed)
 Anesthesia Evaluation  Patient identified by MRN, date of birth, ID band Patient awake    Reviewed: Allergy & Precautions, H&P , NPO status , Patient's Chart, lab work & pertinent test results, reviewed documented beta blocker date and time   Airway Mallampati: II   Neck ROM: full    Dental  (+) Poor Dentition   Pulmonary neg pulmonary ROS   Pulmonary exam normal        Cardiovascular Exercise Tolerance: Good hypertension, On Medications negative cardio ROS Normal cardiovascular exam Rhythm:regular Rate:Normal     Neuro/Psych negative neurological ROS  negative psych ROS   GI/Hepatic Neg liver ROS,GERD  Medicated,,  Endo/Other  Hypothyroidism    Renal/GU negative Renal ROS  negative genitourinary   Musculoskeletal   Abdominal   Peds  Hematology negative hematology ROS (+)   Anesthesia Other Findings Past Medical History: No date: Degenerative disc disease, lumbar No date: Difficult intubation No date: GERD (gastroesophageal reflux disease) No date: Hypercholesterolemia No date: Hypertension No date: Hypothyroidism No date: Late latent syphilis No date: Osteopenia No date: Reactive airway disease, moderate persistent, uncomplicated No date: Sinus bradycardia No date: Thyroid  disease Past Surgical History: 04/23/2019: ANTERIOR CERVICAL DECOMP/DISCECTOMY FUSION; N/A     Comment:  Procedure: ANTERIOR CERVICAL DECOMPRESSION/DISCECTOMY               FUSION 1 LEVEL C4-5;  Surgeon: Clois Fret, MD;                Location: [facility] ORS;  Service: Neurosurgery;  Laterality:               N/A; 1994: BACK SURGERY     Comment:  herniated disc removed lumbar area ([place] ) 1983: CESAREAN SECTION No date: COLONOSCOPY 04/29/2021: COLONOSCOPY WITH PROPOFOL ; N/A     Comment:  Procedure: COLONOSCOPY WITH PROPOFOL ;  Surgeon: Onita Elspeth Sharper, DO;  Location: [facility] ENDOSCOPY;  Service:                Gastroenterology;  Laterality: N/A; No date: TONSILLECTOMY No date: TONSILLECTOMY AND ADENOIDECTOMY     Comment:  teenager No date: TUBAL LIGATION BMI    Body Mass Index: 27.93 kg/m     Reproductive/Obstetrics negative OB ROS                              Anesthesia Physical Anesthesia Plan  ASA: 2  Anesthesia Plan: General   Post-op Pain Management:    Induction:   PONV Risk Score and Plan:   Airway Management Planned:   Additional Equipment:   Intra-op Plan:   Post-operative Plan:   Informed Consent: I have reviewed the patients History and Physical, chart, labs and discussed the procedure including the risks, benefits and alternatives for the proposed anesthesia with the patient or authorized representative who has indicated his/her understanding and acceptance.     Dental Advisory Given  Plan Discussed with: CRNA  Anesthesia Plan Comments:         Anesthesia Quick Evaluation

## 2024-03-31 NOTE — Transfer of Care (Signed)
 Immediate Anesthesia Transfer of Care Note  Patient: Kathryn Whitehead  Procedure(s) Performed: COLONOSCOPY POLYPECTOMY, INTESTINE  Patient Location: PACU  Anesthesia Type:MAC  Level of Consciousness: drowsy  Airway & Oxygen Therapy: Patient Spontanous Breathing and Patient connected to nasal cannula oxygen  Post-op Assessment: Report given to RN and Post -op Vital signs reviewed and stable  Post vital signs: Reviewed and stable  Last Vitals:  Vitals Value Taken Time  BP 107/68 03/31/24 09:42  Temp    Pulse 52 03/31/24 09:42  Resp 16 03/31/24 09:42  SpO2 98 % 03/31/24 09:42    Last Pain:  Vitals:   03/31/24 0942  TempSrc:   PainSc: 0-No pain         Complications: No notable events documented.

## 2024-03-31 NOTE — Interval H&P Note (Signed)
 History and Physical Interval Note: Preprocedure H&P from 03/31/24  was reviewed and there was no interval change after seeing and examining the patient.  Written consent was obtained from the patient after discussion of risks, benefits, and alternatives. Patient has consented to proceed with Colonoscopy with possible intervention   03/31/2024 9:08 AM  Kathryn Whitehead  has presented today for surgery, with the diagnosis of History of adenomatous polyp of colon [Z86.0101].  The various methods of treatment have been discussed with the patient and family. After consideration of risks, benefits and other options for treatment, the patient has consented to  Procedure(s): COLONOSCOPY (N/A) as a surgical intervention.  The patient's history has been reviewed, patient examined, no change in status, stable for surgery.  I have reviewed the patient's chart and labs.  Questions were answered to the patient's satisfaction.     Kathryn Whitehead

## 2024-03-31 NOTE — Anesthesia Postprocedure Evaluation (Signed)
 Anesthesia Post Note  Patient: Kathryn Whitehead  Procedure(s) Performed: COLONOSCOPY POLYPECTOMY, INTESTINE  Patient location during evaluation: PACU Anesthesia Type: General Level of consciousness: awake and alert Pain management: pain level controlled Vital Signs Assessment: post-procedure vital signs reviewed and stable Respiratory status: spontaneous breathing, nonlabored ventilation, respiratory function stable and patient connected to nasal cannula oxygen Cardiovascular status: blood pressure returned to baseline and stable Postop Assessment: no apparent nausea or vomiting Anesthetic complications: no   No notable events documented.   Last Vitals:  Vitals:   03/31/24 0952 03/31/24 1003  BP: 119/67 120/62  Pulse: (!) 54 (!) 52  Resp: 16 17  Temp:    SpO2: 98% 100%    Last Pain:  Vitals:   03/31/24 1003  TempSrc:   PainSc: 0-No pain                 Lynwood KANDICE Clause

## 2024-04-01 LAB — SURGICAL PATHOLOGY

## 2024-06-27 ENCOUNTER — Other Ambulatory Visit: Payer: Self-pay | Admitting: Physician Assistant

## 2024-06-27 DIAGNOSIS — I10 Essential (primary) hypertension: Secondary | ICD-10-CM

## 2024-06-27 DIAGNOSIS — R319 Hematuria, unspecified: Secondary | ICD-10-CM

## 2024-06-27 DIAGNOSIS — Z1231 Encounter for screening mammogram for malignant neoplasm of breast: Secondary | ICD-10-CM

## 2024-06-30 ENCOUNTER — Ambulatory Visit
Admission: RE | Admit: 2024-06-30 | Discharge: 2024-06-30 | Disposition: A | Discharge status: Home / Self Care | Source: Ambulatory Visit | Attending: Physician Assistant | Admitting: Physician Assistant

## 2024-06-30 DIAGNOSIS — R319 Hematuria, unspecified: Secondary | ICD-10-CM | POA: Insufficient documentation

## 2024-06-30 DIAGNOSIS — I10 Essential (primary) hypertension: Secondary | ICD-10-CM | POA: Diagnosis present

## 2024-09-08 ENCOUNTER — Encounter
